# Patient Record
Sex: Female | Born: 1990 | Race: Black or African American | Hispanic: No | Marital: Single | Smoking: Never smoker
Health system: Southern US, Community
[De-identification: ages and names within clinical notes are randomized; demographics above are authoritative.]

## PROBLEM LIST (undated history)

## (undated) HISTORY — PX: OTHER SURGICAL HISTORY: SHX169

## (undated) HISTORY — PX: EYE SURGERY: SHX253

---
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
## 2012-01-04 DIAGNOSIS — Z0000 Encounter for general adult medical examination without abnormal findings: Secondary | ICD-10-CM | POA: Insufficient documentation

## 2016-06-28 ENCOUNTER — Encounter (HOSPITAL_BASED_OUTPATIENT_CLINIC_OR_DEPARTMENT_OTHER): Payer: Self-pay | Admitting: Emergency Medicine

## 2016-06-28 ENCOUNTER — Emergency Department (HOSPITAL_BASED_OUTPATIENT_CLINIC_OR_DEPARTMENT_OTHER)
Admission: EM | Admit: 2016-06-28 | Discharge: 2016-06-28 | Disposition: A | Discharge status: Home / Self Care | Payer: Self-pay | Attending: Emergency Medicine | Admitting: Emergency Medicine

## 2016-06-28 DIAGNOSIS — J020 Streptococcal pharyngitis: Secondary | ICD-10-CM | POA: Insufficient documentation

## 2016-06-28 DIAGNOSIS — R509 Fever, unspecified: Secondary | ICD-10-CM

## 2016-06-28 LAB — RAPID STREP SCREEN (MED CTR MEBANE ONLY): STREPTOCOCCUS, GROUP A SCREEN (DIRECT): POSITIVE — AB

## 2016-06-28 MED ORDER — ACETAMINOPHEN 500 MG PO TABS
1000.0000 mg | ORAL_TABLET | Freq: Once | ORAL | Status: AC
  Administered 2016-06-28: 1000 mg via ORAL
  Filled 2016-06-28: qty 2

## 2016-06-28 MED ORDER — IBUPROFEN 800 MG PO TABS
800.0000 mg | ORAL_TABLET | Freq: Once | ORAL | Status: AC
  Administered 2016-06-28: 800 mg via ORAL
  Filled 2016-06-28: qty 1

## 2016-06-28 MED ORDER — PENICILLIN G BENZATHINE 1200000 UNIT/2ML IM SUSP
1.2000 10*6.[IU] | Freq: Once | INTRAMUSCULAR | Status: AC
Start: 2016-06-28 — End: 2016-06-28
  Administered 2016-06-28: 1.2 10*6.[IU] via INTRAMUSCULAR
  Filled 2016-06-28: qty 2

## 2016-06-28 NOTE — ED Triage Notes (Signed)
Fevers, fatigue, sore throat since yesterday.

## 2016-06-28 NOTE — ED Provider Notes (Signed)
[facility]   CSN: [id number] Arrival date & time: 06/28/16  2203     History   Chief Complaint Chief Complaint  Patient presents with  . Fever    HPI Susan Williamson is a 25 y.o. female.  Pt has had a sore throat and fever since yesterday.  She has taken tylenol and ibuprofen, but still has a fever.  She feels like she is getting worse.      History reviewed. No pertinent past medical history.  There are no active problems to display for this patient.   Past Surgical History:  Procedure Laterality Date  . EYE SURGERY    . skull surgery      OB History    No data available       Home Medications    Prior to Admission medications   Not on File    Family History No family history on file.  Social History Social History  Substance Use Topics  . Smoking status: Never Smoker  . Smokeless tobacco: Never Used  . Alcohol use Yes     Comment: minimal     Allergies   Review of patient's allergies indicates no known allergies.   Review of Systems Review of Systems  Constitutional: Positive for fever.  HENT: Positive for sore throat.   All other systems reviewed and are negative.    Physical Exam Updated Vital Signs BP 141/83 (BP Location: Right Arm)   Pulse 114   Temp 100.3 F (37.9 C) (Oral)   Resp 18   Ht 5\' 8"  (1.727 m)   Wt 200 lb (90.7 kg)   LMP 06/28/2016 (Exact Date)   SpO2 99%   BMI 30.41 kg/m   Physical Exam  Constitutional: She is oriented to person, place, and time. She appears well-developed and well-nourished.  HENT:  Head: Normocephalic and atraumatic.  Right Ear: External ear normal.  Left Ear: External ear normal.  Mouth/Throat: Oropharyngeal exudate and posterior oropharyngeal erythema present.  Eyes: Conjunctivae and EOM are normal. Pupils are equal, round, and reactive to light.  Neck: Normal range of motion. Neck supple.  Cardiovascular: Normal rate, regular rhythm, normal heart sounds  and intact distal pulses.   Pulmonary/Chest: Effort normal and breath sounds normal.  Abdominal: Soft. Bowel sounds are normal.  Musculoskeletal: Normal range of motion.  Neurological: She is alert and oriented to person, place, and time.  Skin: Skin is warm and dry.  Psychiatric: She has a normal mood and affect. Her behavior is normal. Judgment and thought content normal.  Nursing note and vitals reviewed.    ED Treatments / Results  Labs (all labs ordered are listed, but only abnormal results are displayed) Labs Reviewed  RAPID STREP SCREEN (NOT AT [facility]) - Abnormal; Notable for the following:       Result Value   Streptococcus, Group A Screen (Direct) POSITIVE (*)    All other components within normal limits    EKG  EKG Interpretation None       Radiology No results found.  Procedures Procedures (including critical care time)  Medications Ordered in ED Medications  penicillin g benzathine (BICILLIN LA) 1200000 UNIT/2ML injection 1.2 Million Units (not administered)  ibuprofen (ADVIL,MOTRIN) tablet 800 mg (not administered)  acetaminophen (TYLENOL) tablet 1,000 mg (not administered)     Initial Impression / Assessment and Plan / ED Course  I have reviewed the triage vital signs and the nursing notes.  Pertinent labs & imaging results that were available  during my care of the patient were reviewed by me and considered in my medical decision making (see chart for details).  Clinical Course   Pt opted for bicillin la IM.  She did not want any pain meds.  She knows to take tylenol or ibuprofen for fever.  She knows to return if worse.  Final Clinical Impressions(s) / ED Diagnoses   Final diagnoses:  Strep pharyngitis  Fever, unspecified fever cause    New Prescriptions New Prescriptions   No medications on file     Jacalyn LefevreJulie Hattie Aguinaldo, MD 06/28/16 2315

## 2018-11-14 ENCOUNTER — Emergency Department: Payer: No Typology Code available for payment source

## 2018-11-14 ENCOUNTER — Emergency Department
Admission: EM | Admit: 2018-11-14 | Discharge: 2018-11-14 | Disposition: A | Discharge status: Home / Self Care | Payer: No Typology Code available for payment source | Attending: Emergency Medicine | Admitting: Emergency Medicine

## 2018-11-14 ENCOUNTER — Other Ambulatory Visit: Payer: Self-pay

## 2018-11-14 ENCOUNTER — Encounter: Payer: Self-pay | Admitting: Emergency Medicine

## 2018-11-14 DIAGNOSIS — Y92410 Unspecified street and highway as the place of occurrence of the external cause: Secondary | ICD-10-CM | POA: Diagnosis not present

## 2018-11-14 DIAGNOSIS — R51 Headache: Secondary | ICD-10-CM

## 2018-11-14 DIAGNOSIS — R42 Dizziness and giddiness: Secondary | ICD-10-CM | POA: Insufficient documentation

## 2018-11-14 DIAGNOSIS — S0990XA Unspecified injury of head, initial encounter: Secondary | ICD-10-CM | POA: Diagnosis not present

## 2018-11-14 DIAGNOSIS — Y93I9 Activity, other involving external motion: Secondary | ICD-10-CM | POA: Diagnosis not present

## 2018-11-14 DIAGNOSIS — Y998 Other external cause status: Secondary | ICD-10-CM | POA: Diagnosis not present

## 2018-11-14 DIAGNOSIS — R519 Headache, unspecified: Secondary | ICD-10-CM

## 2018-11-14 LAB — BASIC METABOLIC PANEL
Anion gap: 8 (ref 5–15)
BUN: 11 mg/dL (ref 6–20)
CALCIUM: 9.7 mg/dL (ref 8.9–10.3)
CO2: 26 mmol/L (ref 22–32)
Chloride: 104 mmol/L (ref 98–111)
Creatinine, Ser: 0.85 mg/dL (ref 0.44–1.00)
GFR calc non Af Amer: >60 mL/min (ref >60)
Glucose, Bld: 92 mg/dL (ref 70–99)
Potassium: 4 mmol/L (ref 3.5–5.1)
SODIUM: 138 mmol/L (ref 135–145)

## 2018-11-14 LAB — URINALYSIS, COMPLETE (UACMP) WITH MICROSCOPIC
BACTERIA UA: NONE SEEN
Bilirubin Urine: NEGATIVE
Glucose, UA: NEGATIVE mg/dL
Ketones, ur: NEGATIVE mg/dL
Leukocytes, UA: NEGATIVE
NITRITE: NEGATIVE
PROTEIN: NEGATIVE mg/dL
SPECIFIC GRAVITY, URINE: 1.016 (ref 1.005–1.030)
pH: 6 (ref 5.0–8.0)

## 2018-11-14 LAB — CBC WITH DIFFERENTIAL/PLATELET
Abs Immature Granulocytes: 0.05 10*3/uL (ref 0.00–0.07)
BASOS ABS: 0 10*3/uL (ref 0.0–0.1)
Basophils Relative: 0 %
EOS ABS: 0.2 10*3/uL (ref 0.0–0.5)
EOS PCT: 2 %
HCT: 41.1 % (ref 36.0–46.0)
Hemoglobin: 13.4 g/dL (ref 12.0–15.0)
Immature Granulocytes: 1 %
Lymphocytes Relative: 28 %
Lymphs Abs: 2.7 10*3/uL (ref 0.7–4.0)
MCH: 26.9 pg (ref 26.0–34.0)
MCHC: 32.6 g/dL (ref 30.0–36.0)
MCV: 82.5 fL (ref 80.0–100.0)
MONO ABS: 0.5 10*3/uL (ref 0.1–1.0)
Monocytes Relative: 5 %
NRBC: 0 % (ref 0.0–0.2)
Neutro Abs: 6.3 10*3/uL (ref 1.7–7.7)
Neutrophils Relative %: 64 %
Platelets: 327 10*3/uL (ref 150–400)
RBC: 4.98 MIL/uL (ref 3.87–5.11)
RDW: 13 % (ref 11.5–15.5)
WBC: 9.9 10*3/uL (ref 4.0–10.5)

## 2018-11-14 LAB — URINE DRUG SCREEN, QUALITATIVE (ARMC ONLY)
Amphetamines, Ur Screen: NOT DETECTED
Barbiturates, Ur Screen: NOT DETECTED
Benzodiazepine, Ur Scrn: NOT DETECTED
CANNABINOID 50 NG, UR ~~LOC~~: NOT DETECTED
COCAINE METABOLITE, UR ~~LOC~~: NOT DETECTED
MDMA (ECSTASY) UR SCREEN: NOT DETECTED
METHADONE SCREEN, URINE: NOT DETECTED
Opiate, Ur Screen: NOT DETECTED
Phencyclidine (PCP) Ur S: NOT DETECTED
TRICYCLIC, UR SCREEN: NOT DETECTED

## 2018-11-14 LAB — POCT PREGNANCY, URINE: PREG TEST UR: NEGATIVE
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
## 2018-11-14 NOTE — Discharge Instructions (Signed)
Advised only Tylenol for headache until evaluation by neurologist.
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
## 2018-11-14 NOTE — ED Notes (Signed)
Pt signed esignature.  D/c  inst to pt.  
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
## 2018-11-18 DIAGNOSIS — G479 Sleep disorder, unspecified: Secondary | ICD-10-CM | POA: Insufficient documentation

## 2018-11-18 DIAGNOSIS — R569 Unspecified convulsions: Secondary | ICD-10-CM | POA: Insufficient documentation

## 2018-11-18 DIAGNOSIS — R4020 Unspecified coma: Secondary | ICD-10-CM | POA: Insufficient documentation

## 2021-08-29 ENCOUNTER — Other Ambulatory Visit: Payer: Self-pay

## 2021-08-29 ENCOUNTER — Ambulatory Visit
Admission: EM | Admit: 2021-08-29 | Discharge: 2021-08-29 | Disposition: A | Discharge status: Home / Self Care | Payer: 59

## 2021-08-29 ENCOUNTER — Encounter: Payer: Self-pay | Admitting: Emergency Medicine

## 2021-08-29 DIAGNOSIS — G43909 Migraine, unspecified, not intractable, without status migrainosus: Secondary | ICD-10-CM

## 2021-08-29 MED ORDER — KETOROLAC TROMETHAMINE 30 MG/ML IJ SOLN
30.0000 mg | Freq: Once | INTRAMUSCULAR | Status: AC
  Administered 2021-08-29: 30 mg via INTRAMUSCULAR

## 2021-08-29 NOTE — ED Triage Notes (Signed)
Patient has been dealing with a migraine since Friday, still there Saturday, felt better on Sunday and this morning.  After awaking from a nap today, migraine is back.  Patient is feeling numbness in mouth and hands.  Patient has taken Advil, Excedrin.

## 2021-08-29 NOTE — ED Provider Notes (Signed)
[facility]    CSN: [id number] Arrival date & time: 08/29/21  1528      History   Chief Complaint Chief Complaint  Patient presents with   Migraine    HPI Susan Williamson is a 30 y.o. female.   Patient here today for evaluation of headache that has been ongoing for the last several days. She reports she did feel somewhat better this morning but then headache returned. She did have migraine once in the past that occurred after abruptly discontinuing contraceptive but endorses headaches since she was a child. She has had some mild nausea but no vomiting. She has also had sensitivity to light and sound today. She reports pain is located to the right frontal area of her head. She has tried advil and excedrin without resolution of headache today.   The history is provided by the patient.  Migraine Associated symptoms include headaches. Pertinent negatives include no shortness of breath.   History reviewed. No pertinent past medical history.  There are no problems to display for this patient.   Past Surgical History:  Procedure Laterality Date   EYE SURGERY     skull surgery      OB History   No obstetric history on file.      Home Medications    Prior to Admission medications   Medication Sig Start Date End Date Taking? Authorizing Provider  metFORMIN (GLUCOPHAGE) 500 MG tablet metformin 500 mg tablet  TAKE 1 TABLET BY MOUTH THREE TIMES A DAY   Yes [provider]  YAZ 3-0.02 MG tablet Take 1 tablet by mouth daily. 08/08/21  Yes [provider]    Family History History reviewed. No pertinent family history.  Social History Social History   Tobacco Use   Smoking status: Never   Smokeless tobacco: Never  Substance Use Topics   Alcohol use: Yes    Comment: minimal   Drug use: No     Allergies   Patient has no known allergies.   Review of Systems Review of Systems  Constitutional:  Negative for chills and fever.  HENT:   Negative for congestion.   Eyes:  Negative for discharge and redness.  Respiratory:  Negative for cough and shortness of breath.   Gastrointestinal:  Positive for nausea. Negative for vomiting.  Neurological:  Positive for headaches. Negative for facial asymmetry.    Physical Exam Triage Vital Signs ED Triage Vitals [08/29/21 1801]  Enc Vitals Group     BP 135/84     Pulse Rate 88     Resp      Temp 98.6 F (37 C)     Temp Source Oral     SpO2 96 %     Weight 209 lb (94.8 kg)     Height 5\' 7"  (1.702 m)     Head Circumference      Peak Flow      Pain Score 0     Pain Loc      Pain Edu?      Excl. in GC?    No data found.  Updated Vital Signs BP 135/84 (BP Location: Left Arm)   Pulse 88   Temp 98.6 F (37 C) (Oral)   Ht 5\' 7"  (1.702 m)   Wt 209 lb (94.8 kg)   LMP 08/08/2021   SpO2 96%   BMI 32.73 kg/m  Physical Exam Vitals and nursing note reviewed.  Constitutional:      General: She is not in  acute distress.    Appearance: Normal appearance. She is not ill-appearing, toxic-appearing or diaphoretic.  HENT:     Head: Normocephalic and atraumatic.  Eyes:     Extraocular Movements: Extraocular movements intact.     Conjunctiva/sclera: Conjunctivae normal.     Pupils: Pupils are equal, round, and reactive to light.  Cardiovascular:     Rate and Rhythm: Normal rate.  Pulmonary:     Effort: Pulmonary effort is normal.  Neurological:     Mental Status: She is alert and oriented to person, place, and time.     Coordination: Coordination is intact. Coordination normal. Finger-Nose-Finger Test normal.  Psychiatric:        Mood and Affect: Mood normal.        Behavior: Behavior normal.     UC Treatments / Results  Labs (all labs ordered are listed, but only abnormal results are displayed) Labs Reviewed - No data to display  EKG   Radiology No results found.  Procedures Procedures (including critical care time)  Medications Ordered in UC Medications   ketorolac (TORADOL) 30 MG/ML injection 30 mg (30 mg Intramuscular Given 08/29/21 1818)    Initial Impression / Assessment and Plan / UC Course  I have reviewed the triage vital signs and the nursing notes.  Pertinent labs & imaging results that were available during my care of the patient were reviewed by me and considered in my medical decision making (see chart for details).   Toradol injection administered to treat suspect migraine. Recommended follow up in the ED if symptoms worsen in any way. Suspect reported numbness is symptom of her migraine but strongly urged emergent evaluation in ED if this does not improve or worsens.   Final Clinical Impressions(s) / UC Diagnoses   Final diagnoses:  Migraine without status migrainosus, not intractable, unspecified migraine type   Discharge Instructions   None    ED Prescriptions   None    PDMP not reviewed this encounter.   Tomi Bamberger, PA-C 08/29/21 1829

## 2021-08-30 ENCOUNTER — Other Ambulatory Visit: Payer: Self-pay

## 2021-08-30 ENCOUNTER — Emergency Department (HOSPITAL_COMMUNITY)
Admission: EM | Admit: 2021-08-30 | Discharge: 2021-08-31 | Disposition: A | Discharge status: Home / Self Care | Payer: 59 | Attending: Emergency Medicine | Admitting: Emergency Medicine

## 2021-08-30 ENCOUNTER — Encounter (HOSPITAL_COMMUNITY): Payer: Self-pay

## 2021-08-30 DIAGNOSIS — N9489 Other specified conditions associated with female genital organs and menstrual cycle: Secondary | ICD-10-CM | POA: Diagnosis not present

## 2021-08-30 DIAGNOSIS — R519 Headache, unspecified: Secondary | ICD-10-CM | POA: Diagnosis not present

## 2021-08-30 LAB — I-STAT BETA HCG BLOOD, ED (MC, WL, AP ONLY): I-stat hCG, quantitative: <5 m[IU]/mL (ref <5)

## 2021-08-30 LAB — CBC WITH DIFFERENTIAL/PLATELET
Abs Immature Granulocytes: 0.05 10*3/uL (ref 0.00–0.07)
Basophils Absolute: 0 10*3/uL (ref 0.0–0.1)
Basophils Relative: 0 %
Eosinophils Absolute: 0.2 10*3/uL (ref 0.0–0.5)
Eosinophils Relative: 3 %
HCT: 36.1 % (ref 36.0–46.0)
Hemoglobin: 11.9 g/dL — ABNORMAL LOW (ref 12.0–15.0)
Immature Granulocytes: 1 %
Lymphocytes Relative: 34 %
Lymphs Abs: 3.1 10*3/uL (ref 0.7–4.0)
MCH: 27.4 pg (ref 26.0–34.0)
MCHC: 33 g/dL (ref 30.0–36.0)
MCV: 83 fL (ref 80.0–100.0)
Monocytes Absolute: 0.6 10*3/uL (ref 0.1–1.0)
Monocytes Relative: 6 %
Neutro Abs: 5.1 10*3/uL (ref 1.7–7.7)
Neutrophils Relative %: 56 %
Platelets: 362 10*3/uL (ref 150–400)
RBC: 4.35 MIL/uL (ref 3.87–5.11)
RDW: 12.9 % (ref 11.5–15.5)
WBC: 9 10*3/uL (ref 4.0–10.5)
nRBC: 0 % (ref 0.0–0.2)

## 2021-08-30 LAB — COMPREHENSIVE METABOLIC PANEL
ALT: 18 U/L (ref 0–44)
AST: 24 U/L (ref 15–41)
Albumin: 3.4 g/dL — ABNORMAL LOW (ref 3.5–5.0)
Alkaline Phosphatase: 39 U/L (ref 38–126)
Anion gap: 8 (ref 5–15)
BUN: 12 mg/dL (ref 6–20)
CO2: 21 mmol/L — ABNORMAL LOW (ref 22–32)
Calcium: 8.6 mg/dL — ABNORMAL LOW (ref 8.9–10.3)
Chloride: 108 mmol/L (ref 98–111)
Creatinine, Ser: 0.7 mg/dL (ref 0.44–1.00)
GFR, Estimated: >60 mL/min (ref >60)
Glucose, Bld: 145 mg/dL — ABNORMAL HIGH (ref 70–99)
Potassium: 3.9 mmol/L (ref 3.5–5.1)
Sodium: 137 mmol/L (ref 135–145)
Total Bilirubin: 0.2 mg/dL — ABNORMAL LOW (ref 0.3–1.2)
Total Protein: 6.8 g/dL (ref 6.5–8.1)

## 2021-08-30 LAB — URINALYSIS, ROUTINE W REFLEX MICROSCOPIC
Bacteria, UA: NONE SEEN
Bilirubin Urine: NEGATIVE
Glucose, UA: NEGATIVE mg/dL
Hgb urine dipstick: NEGATIVE
Ketones, ur: NEGATIVE mg/dL
Nitrite: NEGATIVE
Protein, ur: NEGATIVE mg/dL
Specific Gravity, Urine: 1.026 (ref 1.005–1.030)
pH: 5 (ref 5.0–8.0)

## 2021-08-30 NOTE — ED Notes (Signed)
Labeled specimen cup provided to pt for urine collection per MD order. ENMiles 

## 2021-08-30 NOTE — ED Triage Notes (Signed)
Pt complains of migraine since Friday. Pt states that she was given a pain shot yesterday for her migraine.

## 2021-08-31 MED ORDER — PROCHLORPERAZINE EDISYLATE 10 MG/2ML IJ SOLN
10.0000 mg | Freq: Once | INTRAMUSCULAR | Status: AC
  Administered 2021-08-31: 10 mg via INTRAMUSCULAR
  Filled 2021-08-31: qty 2

## 2021-08-31 MED ORDER — DEXAMETHASONE 4 MG PO TABS
10.0000 mg | ORAL_TABLET | Freq: Once | ORAL | Status: AC
  Administered 2021-08-31: 10 mg via ORAL
  Filled 2021-08-31: qty 2

## 2021-08-31 MED ORDER — DIPHENHYDRAMINE HCL 50 MG/ML IJ SOLN
25.0000 mg | Freq: Once | INTRAMUSCULAR | Status: AC
  Administered 2021-08-31: 25 mg via INTRAMUSCULAR
  Filled 2021-08-31: qty 1

## 2021-08-31 NOTE — ED Provider Notes (Signed)
[facility]-EMERGENCY DEPT Provider Note   CSN: [id number] Arrival date & time: 08/30/21  1947     History Chief Complaint  Patient presents with   Migraine    Susan Williamson is a 30 y.o. female.  30 yo F with a chief complaints of a headache.  This been going on for about 4 days now.  Has a history of headaches in the past but describes them usually as mild.  This feels similar but is a bit worse and lasting a bit longer than normal.  Her mother has a history of migraines.  The patient has been seeing a neurologist in the office.   As they have been in the ED they feel that their symptoms have gotten a bit worse.  Describing diffuse uncomfortableness.  Has trouble describing it.  Feels numb to the hands symptoms around the mouth.  Denies head injury denies cough congestion or fever.  The history is provided by the patient and a significant other.  Migraine This is a new problem. The current episode started less than 1 hour ago. The problem occurs constantly. The problem has not changed since onset.Pertinent negatives include no chest pain, no headaches and no shortness of breath. Nothing aggravates the symptoms. Nothing relieves the symptoms. She has tried nothing for the symptoms. The treatment provided no relief.      History reviewed. No pertinent past medical history.  There are no problems to display for this patient.   Past Surgical History:  Procedure Laterality Date   EYE SURGERY     skull surgery       OB History   No obstetric history on file.     History reviewed. No pertinent family history.  Social History   Tobacco Use   Smoking status: Never   Smokeless tobacco: Never  Substance Use Topics   Alcohol use: Yes    Comment: minimal   Drug use: No    Home Medications Prior to Admission medications   Medication Sig Start Date End Date Taking? Authorizing Provider  metFORMIN (GLUCOPHAGE) 500 MG tablet Take 500 mg by mouth 2 (two)  times daily with a meal.   Yes [provider]  YAZ 3-0.02 MG tablet Take 1 tablet by mouth daily. 08/08/21  Yes [provider]    Allergies    Patient has no known allergies.  Review of Systems   Review of Systems  Constitutional:  Negative for chills and fever.  HENT:  Negative for congestion and rhinorrhea.   Eyes:  Negative for redness and visual disturbance.  Respiratory:  Negative for shortness of breath and wheezing.   Cardiovascular:  Negative for chest pain and palpitations.  Gastrointestinal:  Negative for nausea and vomiting.  Genitourinary:  Negative for dysuria and urgency.  Musculoskeletal:  Negative for arthralgias and myalgias.  Skin:  Negative for pallor and wound.  Neurological:  Negative for dizziness and headaches.   Physical Exam Updated Vital Signs BP (!) 145/99 (BP Location: Left Arm)   Pulse 78   Temp 98.3 F (36.8 C) (Oral)   Resp 18   Ht 5\' 7"  (1.702 m)   Wt 94.8 kg   LMP 08/08/2021   SpO2 100%   BMI 32.73 kg/m   Physical Exam Vitals and nursing note reviewed.  Constitutional:      General: She is not in acute distress.    Appearance: She is well-developed. She is not diaphoretic.  HENT:     Head: Normocephalic and  atraumatic.  Eyes:     Pupils: Pupils are equal, round, and reactive to light.  Cardiovascular:     Rate and Rhythm: Normal rate and regular rhythm.     Heart sounds: No murmur heard.   No friction rub. No gallop.  Pulmonary:     Effort: Pulmonary effort is normal.     Breath sounds: No wheezing or rales.  Abdominal:     General: There is no distension.     Palpations: Abdomen is soft.     Tenderness: There is no abdominal tenderness.  Musculoskeletal:        General: No tenderness.     Cervical back: Normal range of motion and neck supple.  Skin:    General: Skin is warm and dry.  Neurological:     Mental Status: She is alert and oriented to person, place, and time.     GCS: GCS eye subscore is 4. GCS  verbal subscore is 5. GCS motor subscore is 6.     Cranial Nerves: Cranial nerves 2-12 are intact.     Sensory: Sensation is intact.     Motor: Motor function is intact.     Coordination: Coordination is intact.     Comments: Slightly unsteady gait though able to ambulate independently without issue.  Psychiatric:        Behavior: Behavior normal.    ED Results / Procedures / Treatments   Labs (all labs ordered are listed, but only abnormal results are displayed) Labs Reviewed  URINALYSIS, ROUTINE W REFLEX MICROSCOPIC - Abnormal; Notable for the following components:      Result Value   APPearance HAZY (*)    Leukocytes,Ua TRACE (*)    All other components within normal limits  CBC WITH DIFFERENTIAL/PLATELET - Abnormal; Notable for the following components:   Hemoglobin 11.9 (*)    All other components within normal limits  COMPREHENSIVE METABOLIC PANEL - Abnormal; Notable for the following components:   CO2 21 (*)    Glucose, Bld 145 (*)    Calcium 8.6 (*)    Albumin 3.4 (*)    Total Bilirubin 0.2 (*)    All other components within normal limits  I-STAT BETA HCG BLOOD, ED ([facility], [facility], [facility] ONLY)    EKG None  Radiology No results found.  Procedures Procedures   Medications Ordered in ED Medications  prochlorperazine (COMPAZINE) injection 10 mg (10 mg Intramuscular Given 08/31/21 0028)  diphenhydrAMINE (BENADRYL) injection 25 mg (25 mg Intramuscular Given 08/31/21 0029)  dexamethasone (DECADRON) tablet 10 mg (10 mg Oral Given 08/31/21 3818)    ED Course  I have reviewed the triage vital signs and the nursing notes.  Pertinent labs & imaging results that were available during my care of the patient were reviewed by me and considered in my medical decision making (see chart for details).    MDM Rules/Calculators/A&P                           30 yo genetic female comes in with a chief complaints of headache.  Going on for about 4 days now.  Benign neurologic exam for  me.  We will treat with a headache cocktail.  Neurology follow-up.  Patient is feeling mildly better.  Will discharge home.  1:50 AM:  I have discussed the diagnosis/risks/treatment options with the patient and signficant other  and believe the pt to be eligible for discharge home to follow-up with Neuro, PCP. We  also discussed returning to the ED immediately if new or worsening sx occur. We discussed the sx which are most concerning (e.g., sudden worsening pain, fever, inability to tolerate by mouth) that necessitate immediate return. Medications administered to the patient during their visit and any new prescriptions provided to the patient are listed below.  Medications given during this visit Medications  prochlorperazine (COMPAZINE) injection 10 mg (10 mg Intramuscular Given 08/31/21 0028)  diphenhydrAMINE (BENADRYL) injection 25 mg (25 mg Intramuscular Given 08/31/21 0029)  dexamethasone (DECADRON) tablet 10 mg (10 mg Oral Given 08/31/21 0027)     The patient appears reasonably screen and/or stabilized for discharge and I doubt any other medical condition or other [facility] requiring further screening, evaluation, or treatment in the ED at this time prior to discharge.   Final Clinical Impression(s) / ED Diagnoses Final diagnoses:  Bad headache    Rx / DC Orders ED Discharge Orders          Ordered    Ambulatory referral to Neurology       Comments: Headache syndrome   08/31/21 0135             Melene Plan, DO 08/31/21 0150

## 2021-08-31 NOTE — ED Notes (Signed)
Dr. Floyd at bedside. 

## 2021-08-31 NOTE — ED Notes (Addendum)
Pt ambulatory with a steady gait from triage to [place] [facility].

## 2021-08-31 NOTE — Discharge Instructions (Signed)
Return for worsening headache one-sided numbness or weakness difficulty with speech or swallowing.  Please follow-up with your family doctor and neurology.

## 2021-09-14 ENCOUNTER — Other Ambulatory Visit: Payer: Self-pay | Admitting: Physician Assistant

## 2021-09-14 ENCOUNTER — Other Ambulatory Visit (HOSPITAL_BASED_OUTPATIENT_CLINIC_OR_DEPARTMENT_OTHER): Payer: Self-pay | Admitting: Physician Assistant

## 2021-09-14 DIAGNOSIS — G479 Sleep disorder, unspecified: Secondary | ICD-10-CM

## 2021-09-14 DIAGNOSIS — R200 Anesthesia of skin: Secondary | ICD-10-CM

## 2021-09-14 DIAGNOSIS — R202 Paresthesia of skin: Secondary | ICD-10-CM

## 2021-09-14 DIAGNOSIS — R519 Headache, unspecified: Secondary | ICD-10-CM

## 2021-09-14 DIAGNOSIS — G8929 Other chronic pain: Secondary | ICD-10-CM

## 2021-09-14 DIAGNOSIS — F411 Generalized anxiety disorder: Secondary | ICD-10-CM

## 2021-09-14 DIAGNOSIS — R5383 Other fatigue: Secondary | ICD-10-CM

## 2021-09-14 DIAGNOSIS — R55 Syncope and collapse: Secondary | ICD-10-CM

## 2021-09-21 ENCOUNTER — Other Ambulatory Visit: Payer: Self-pay

## 2021-09-21 ENCOUNTER — Ambulatory Visit
Admission: RE | Admit: 2021-09-21 | Discharge: 2021-09-21 | Disposition: A | Discharge status: Home / Self Care | Payer: 59 | Source: Ambulatory Visit | Attending: Physician Assistant | Admitting: Physician Assistant

## 2021-09-21 DIAGNOSIS — R202 Paresthesia of skin: Secondary | ICD-10-CM | POA: Diagnosis present

## 2021-09-21 DIAGNOSIS — R5383 Other fatigue: Secondary | ICD-10-CM | POA: Diagnosis present

## 2021-09-21 DIAGNOSIS — F411 Generalized anxiety disorder: Secondary | ICD-10-CM | POA: Diagnosis present

## 2021-09-21 DIAGNOSIS — G8929 Other chronic pain: Secondary | ICD-10-CM | POA: Insufficient documentation

## 2021-09-21 DIAGNOSIS — R200 Anesthesia of skin: Secondary | ICD-10-CM | POA: Diagnosis present

## 2021-09-21 DIAGNOSIS — R55 Syncope and collapse: Secondary | ICD-10-CM | POA: Insufficient documentation

## 2021-09-21 DIAGNOSIS — G479 Sleep disorder, unspecified: Secondary | ICD-10-CM | POA: Insufficient documentation

## 2021-09-21 DIAGNOSIS — R519 Headache, unspecified: Secondary | ICD-10-CM | POA: Diagnosis not present

## 2021-09-21 IMAGING — MR MR HEAD W/O CM
6 of 7 series · 24 of 48 positions shown · non-contrast
Comparison: CT head [DATE].

CLINICAL DATA: Chronic nonintractable headache, unspecified
headache type R51.9, [D7] ([D7]-CM)

Other fatigue [D7] ([D7]-CM)
Syncope, unspecified syncope type R55 ([D7]-CM)
Anxiety, generalized [D7] ([D7]-CM)
Sleep disorder [D7] ([D7]-CM)Difficulty sleeping [D7]
([D7]-CM)
Numbness and tingling R20.0, [D7] ([D7]-CM)
EXAM:
MRI HEAD WITHOUT CONTRAST
TECHNIQUE: Multiplanar, multiecho pulse sequences of the brain and surrounding
structures were obtained without intravenous contrast.

[Series 2: T1 · sagittal · 5.0mm · 0.45mm/px · 2 of 23 slices shown]
[im 1/23]
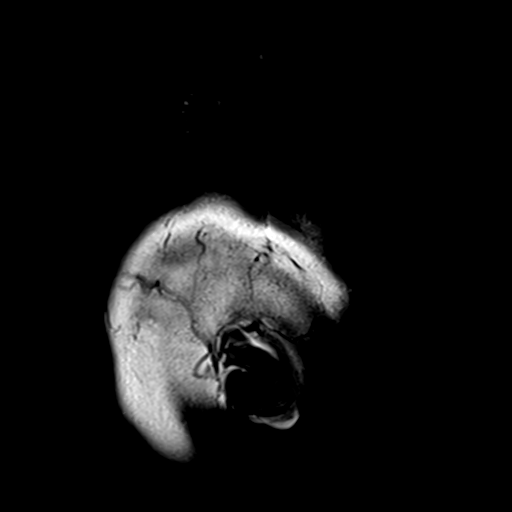
[im 8/23]
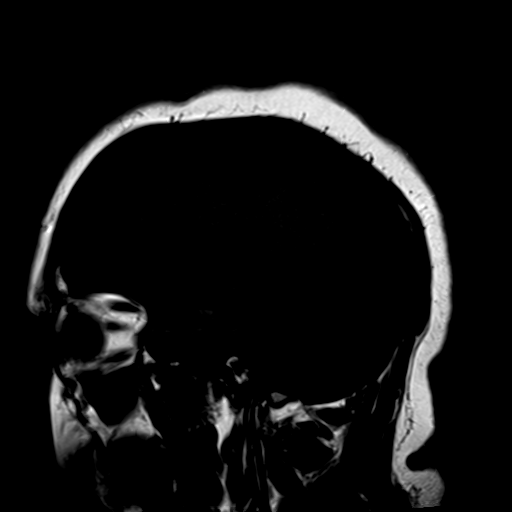

[Series 3: T2 · axial · 5.0mm · 0.90mm/px · z∈[-91,+63]mm · 3 of 23 slices shown (1 of 3)]
[im 1/23]
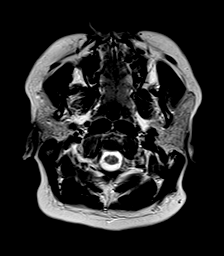
[im 12/23]
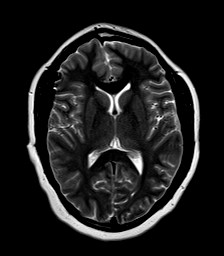
[im 23/23]
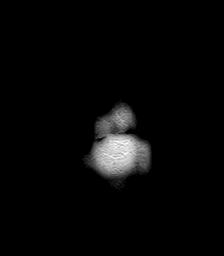

[Series 4: FLAIR · axial · 3.0mm · 0.45mm/px · z∈[-95,+67]mm · 8 of 55 slices shown (1 of 2)]
[im 1/55]
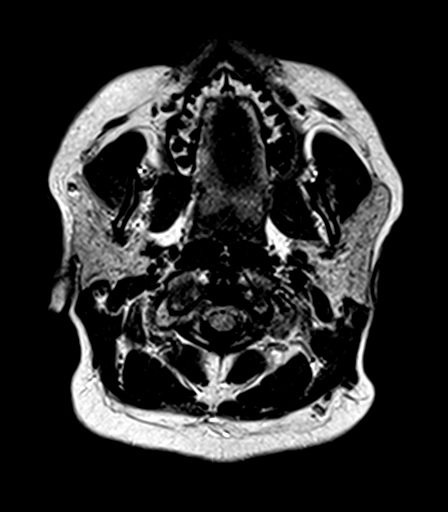
[im 8/55]
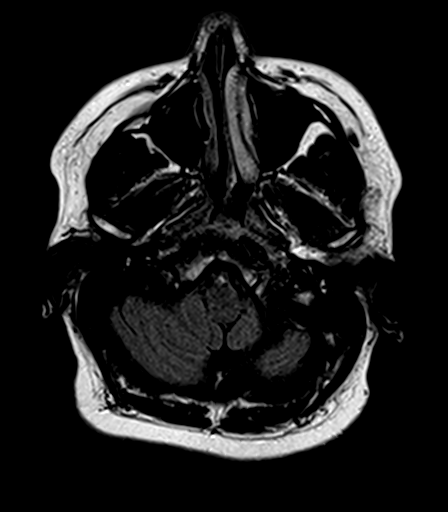
[im 16/55]
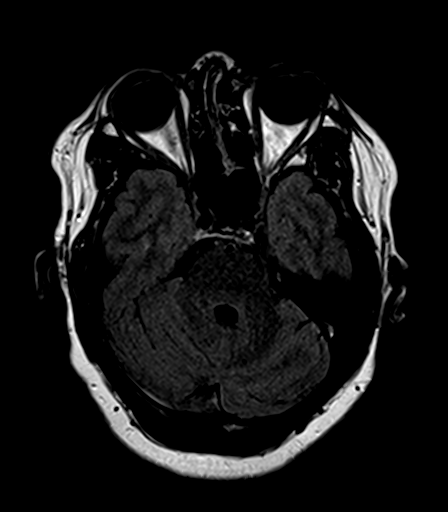
[im 24/55]
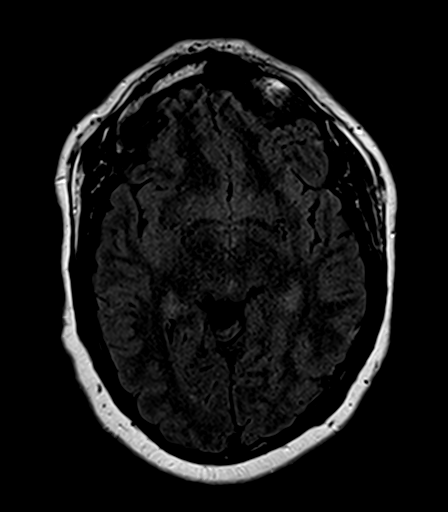
[im 31/55]
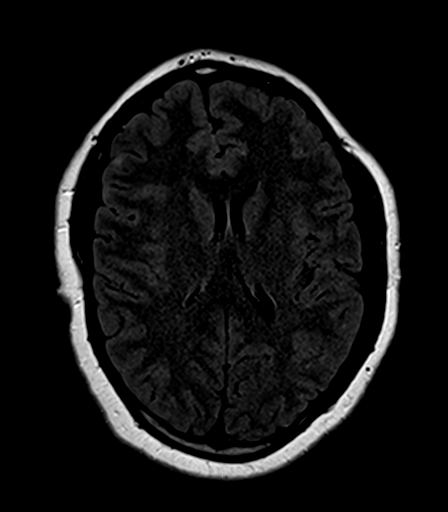
[im 39/55]
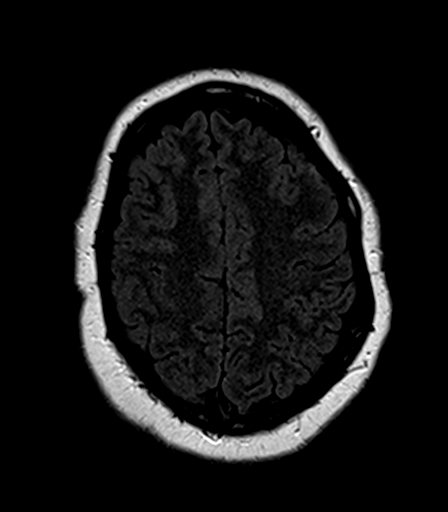
[im 47/55]
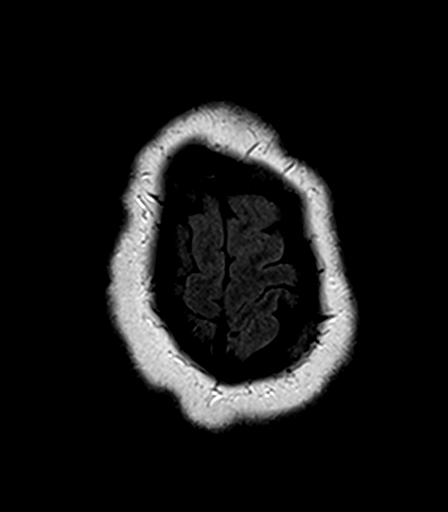
[im 55/55]
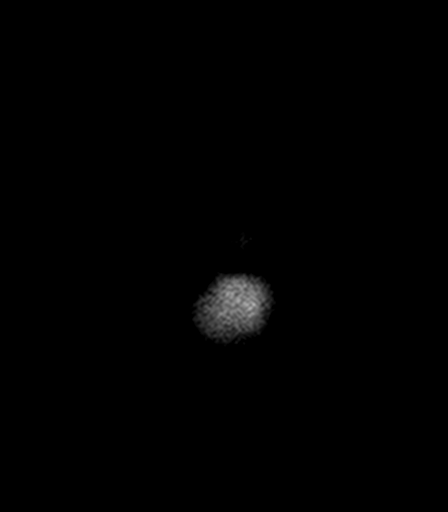

[Series 5: T2 · axial · 5.0mm · 0.72mm/px · z∈[-91,+63]mm · 3 of 23 slices shown (2 of 3)]
[im 1/23]
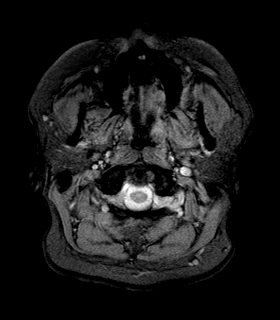
[im 12/23]
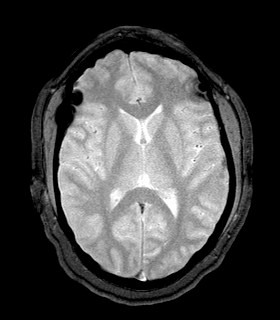
[im 23/23]
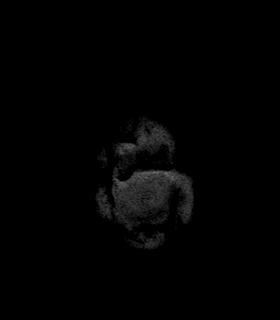

[Series 7: T2 · coronal · 5.0mm · 0.43mm/px · 4 of 29 slices shown (3 of 3)]
[im 1/29]
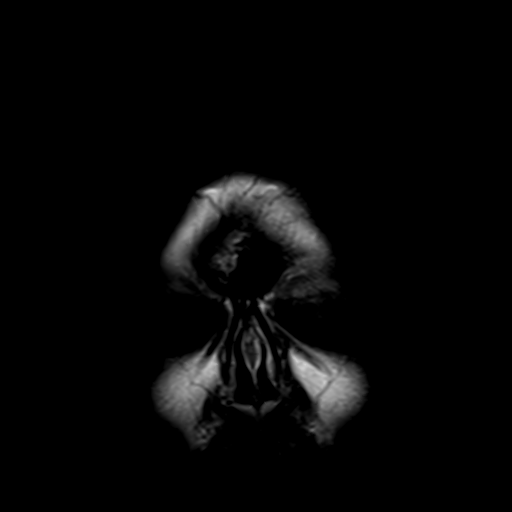
[im 10/29]
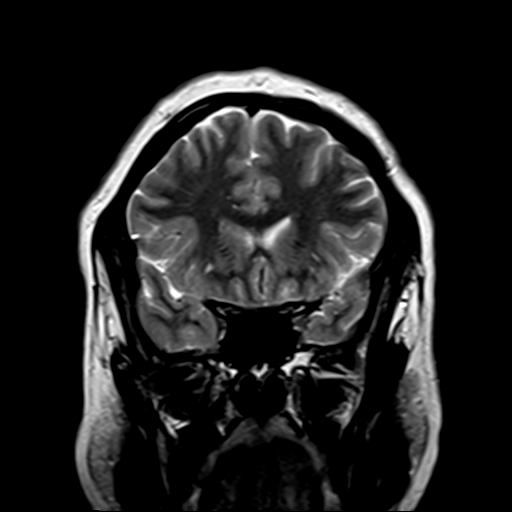
[im 19/29]
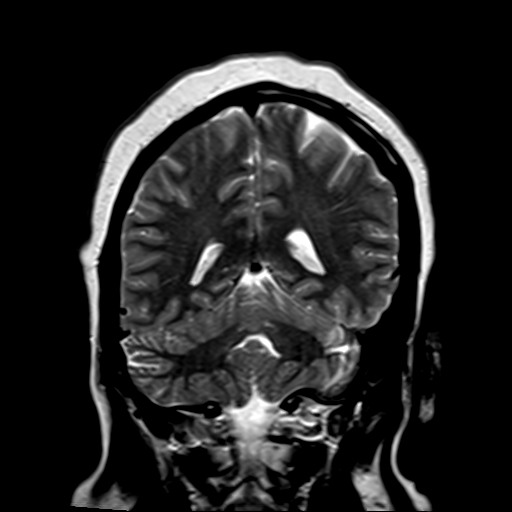
[im 29/29]
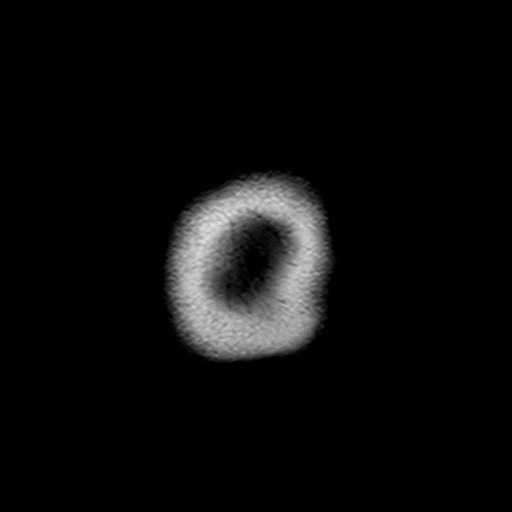

[Series 8: FLAIR · sagittal · 4.0mm · 0.45mm/px · 4 of 30 slices shown (2 of 2)]
[im 1/30]
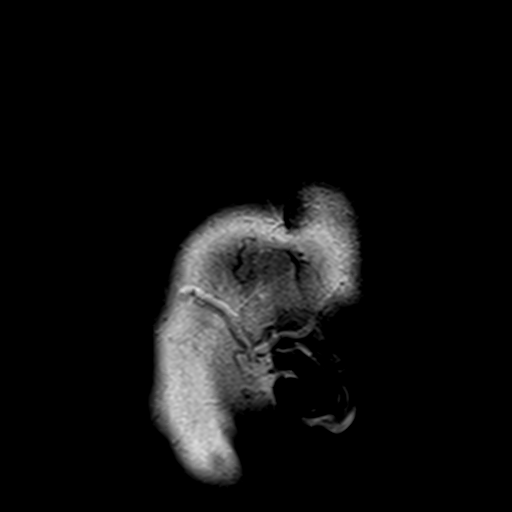
[im 10/30]
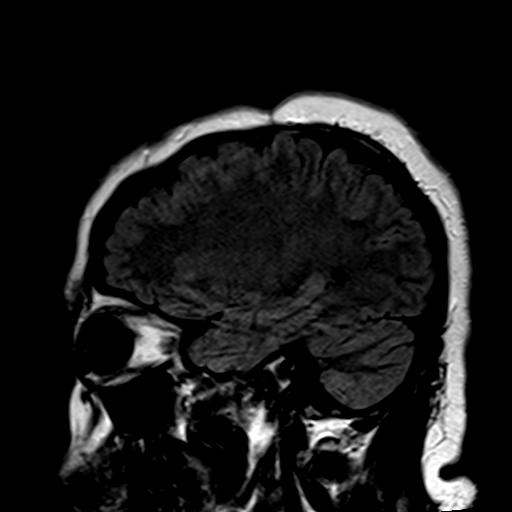
[im 20/30]
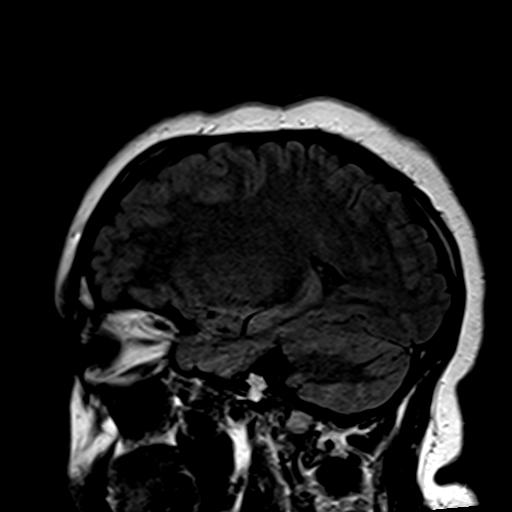
[im 30/30]
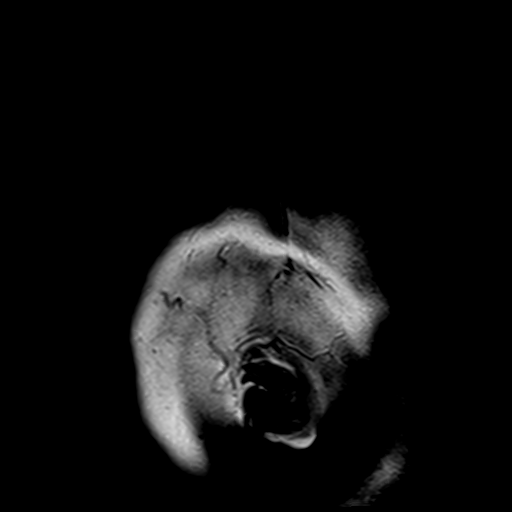

[24 of 48 positions shown; findings below may reference images not displayed]

FINDINGS: Brain: No acute infarction, acute hemorrhage, hydrocephalus,
extra-axial collection or mass lesion. Areas of susceptibility
artifact along the frontal convexity, likely related to postsurgical
change and possibly old hemorrhage given prior skull surgery and
postsurgical changes seen on prior CT head.

Vascular: Major arterial flow voids are maintained.

Skull and upper cervical spine: Postsurgical changes of the right
frontal calvarium. Otherwise, normal marrow signal.

Sinuses/Orbits: Largely clear sinuses.  Unremarkable orbits.

Other: No sizable mastoid effusions.
IMPRESSION: 1. No evidence of acute abnormality.
2. Areas of susceptibility artifact along the frontal convexity,
likely related to postsurgical change and possibly old hemorrhage
given prior skull surgery and postsurgical changes seen on prior CT
head.

## 2021-09-21 IMAGING — MR MR HEAD W/O CM
4 series · 36 of 48 positions shown · non-contrast
Comparison: CT head [DATE].

CLINICAL DATA: Chronic nonintractable headache, unspecified
headache type R51.9, [D7] ([D7]-CM)

Other fatigue [D7] ([D7]-CM)
Syncope, unspecified syncope type R55 ([D7]-CM)
Anxiety, generalized [D7] ([D7]-CM)
Sleep disorder [D7] ([D7]-CM)Difficulty sleeping [D7]
([D7]-CM)
Numbness and tingling R20.0, [D7] ([D7]-CM)
EXAM:
MRI HEAD WITHOUT CONTRAST
TECHNIQUE: Multiplanar, multiecho pulse sequences of the brain and surrounding
structures were obtained without intravenous contrast.

[Series 5: ax dwi_tracew · axial · 3.0mm · 0.65mm/px · z∈[-105,+50]mm · 9 of 48 slices shown]
[im 1/48]
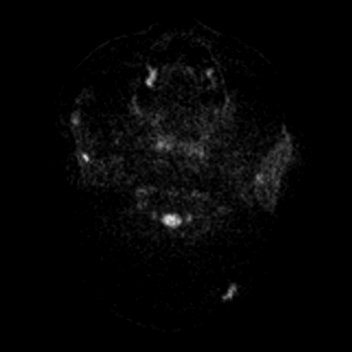
[im 8/48]
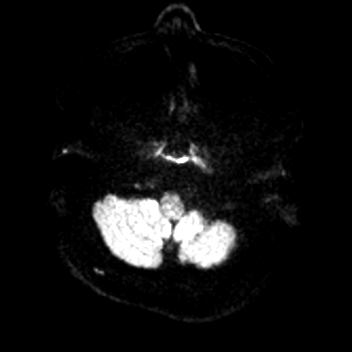
[im 16/48]
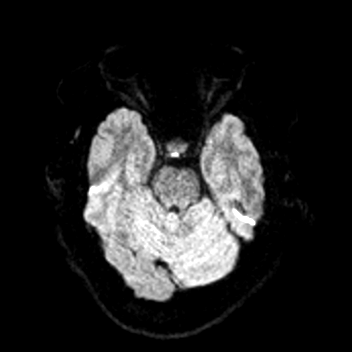
[im 20/48]
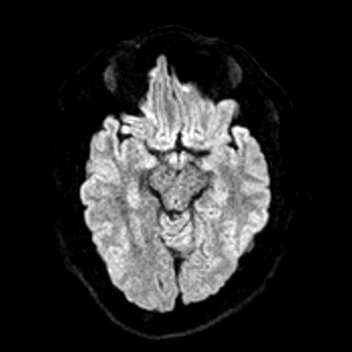
[im 24/48]
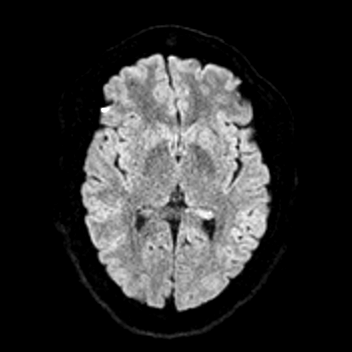
[im 28/48]
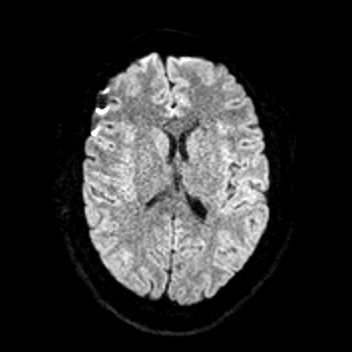
[im 32/48]
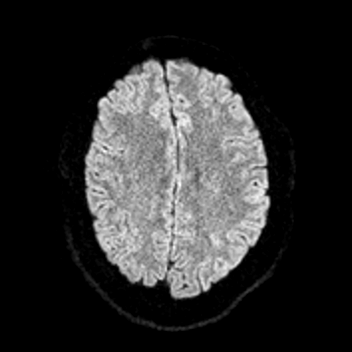
[im 40/48]
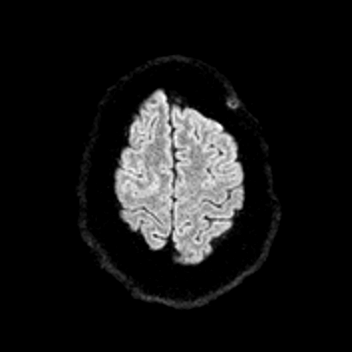
[im 48/48]
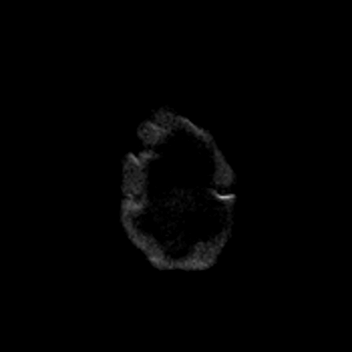

[Series 6: ax dwi_adc · axial · 3.0mm · 0.65mm/px · z∈[-105,+50]mm · 9 of 48 slices shown]
[im 1/48]
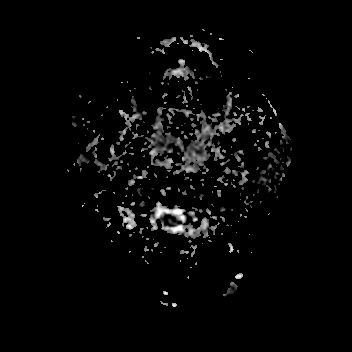
[im 8/48]
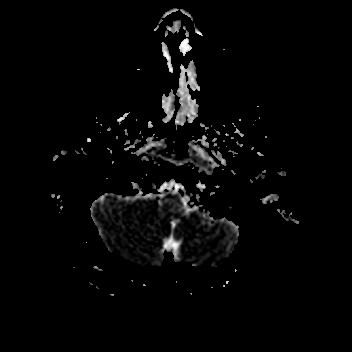
[im 16/48]
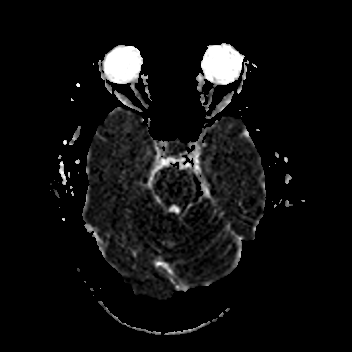
[im 20/48]
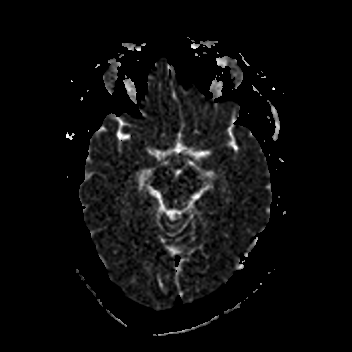
[im 24/48]
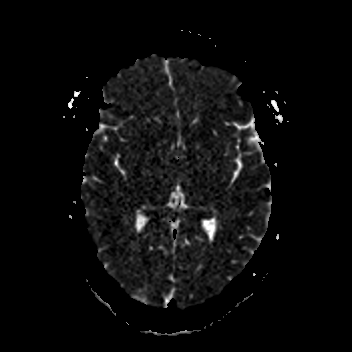
[im 28/48]
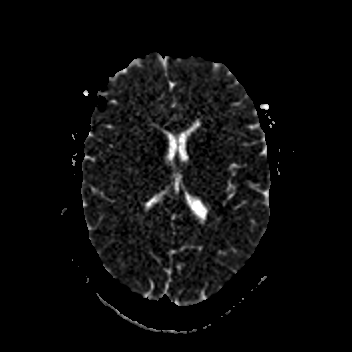
[im 32/48]
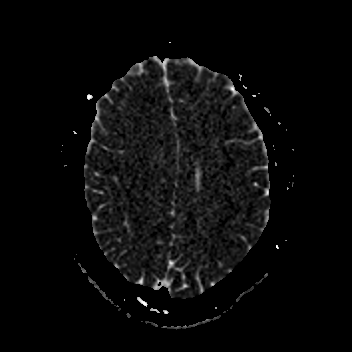
[im 40/48]
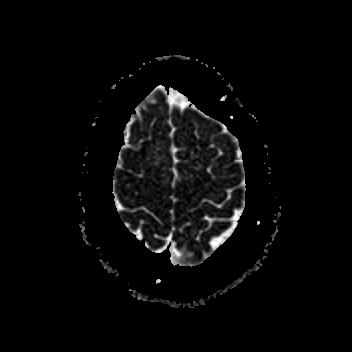
[im 48/48]
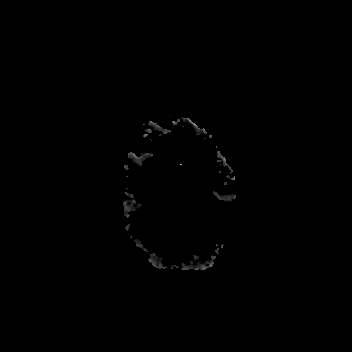

[Series 7: cor dwi_tracew · coronal · 5.0mm · 0.60mm/px · 11 of 38 slices shown]
[im 1/38]
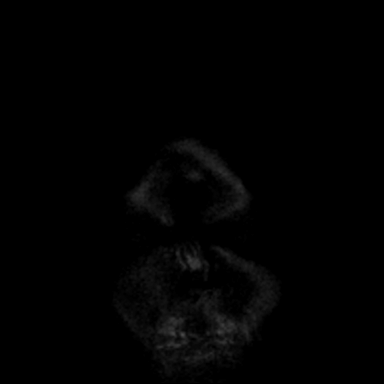
[im 4/38]
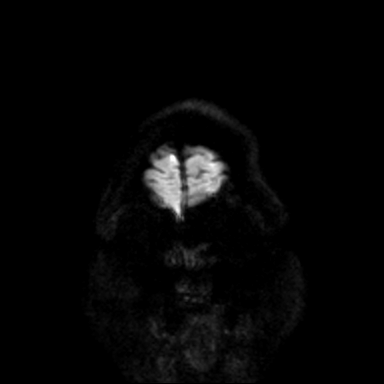
[im 8/38]
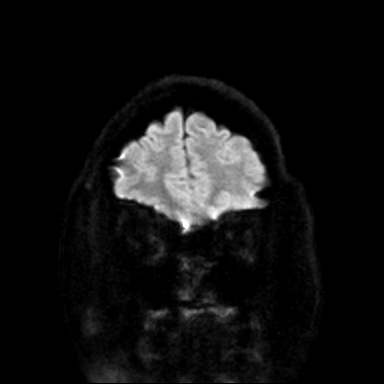
[im 12/38]
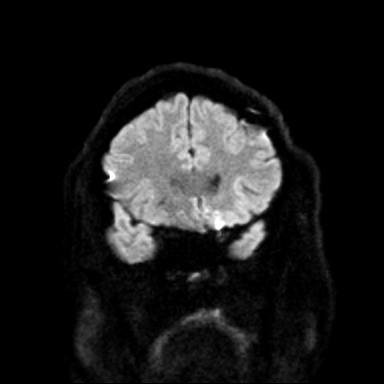
[im 15/38]
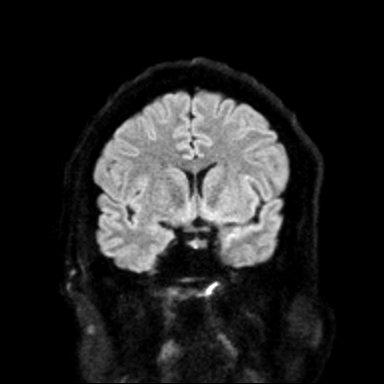
[im 19/38]
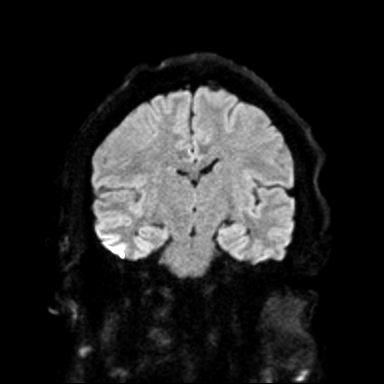
[im 23/38]
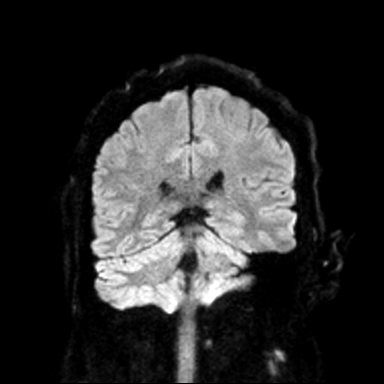
[im 26/38]
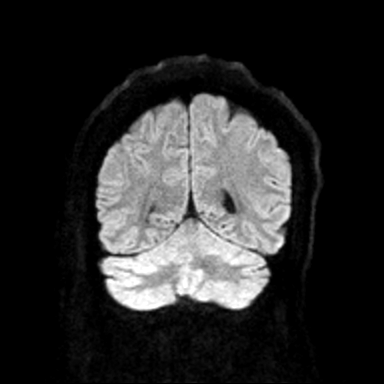
[im 30/38]
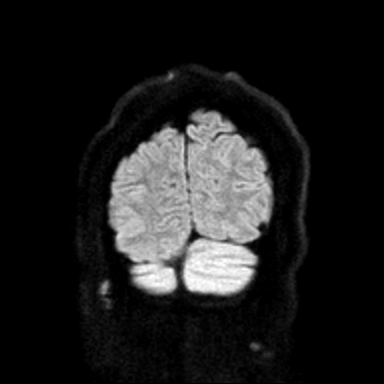
[im 34/38]
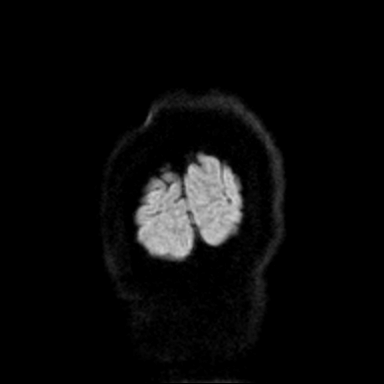
[im 38/38]
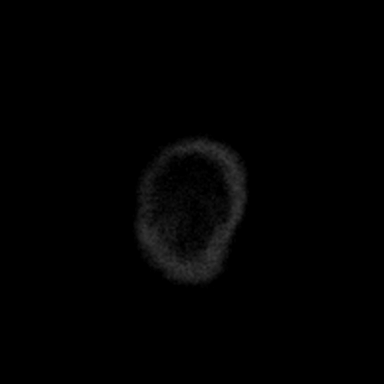

[Series 8: cor dwi_adc · coronal · 5.0mm · 0.60mm/px · 7 of 38 slices shown]
[im 1/38]
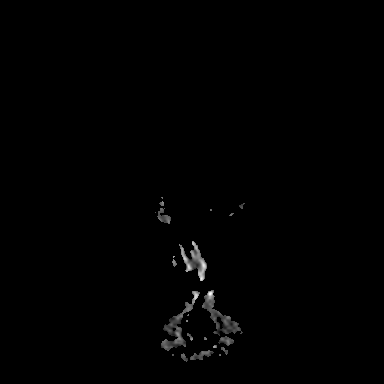
[im 4/38]
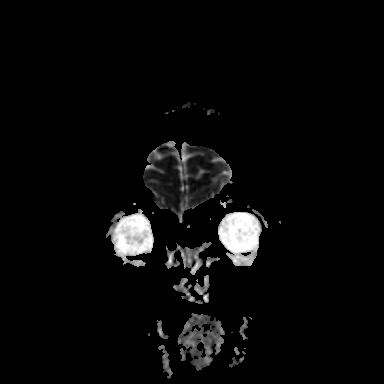
[im 8/38]
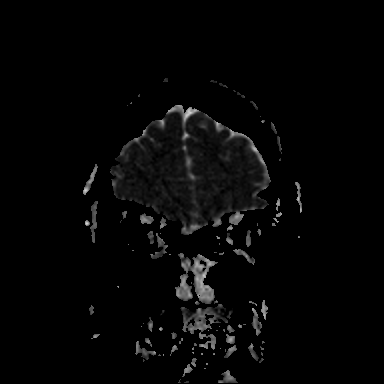
[im 12/38]
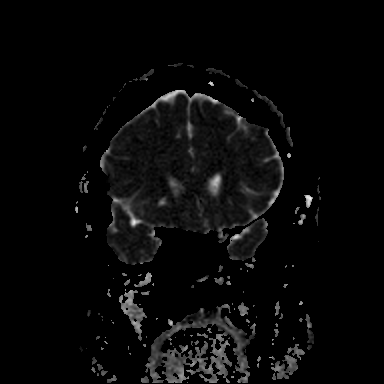
[im 15/38]
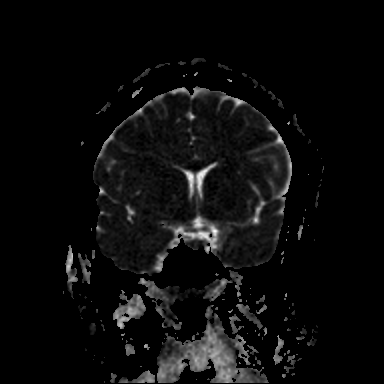
[im 19/38]
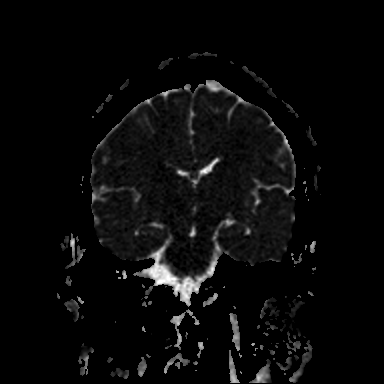
[im 34/38]
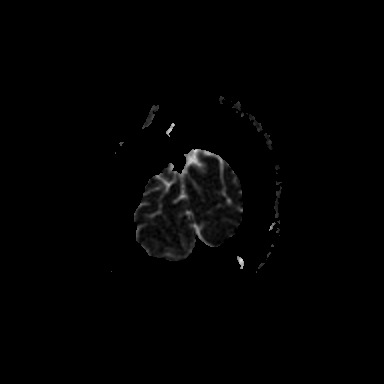

[36 of 48 positions shown; findings below may reference images not displayed]

FINDINGS: Brain: No acute infarction, acute hemorrhage, hydrocephalus,
extra-axial collection or mass lesion. Areas of susceptibility
artifact along the frontal convexity, likely related to postsurgical
change and possibly old hemorrhage given prior skull surgery and
postsurgical changes seen on prior CT head.

Vascular: Major arterial flow voids are maintained.

Skull and upper cervical spine: Postsurgical changes of the right
frontal calvarium. Otherwise, normal marrow signal.

Sinuses/Orbits: Largely clear sinuses.  Unremarkable orbits.

Other: No sizable mastoid effusions.
IMPRESSION: 1. No evidence of acute abnormality.
2. Areas of susceptibility artifact along the frontal convexity,
likely related to postsurgical change and possibly old hemorrhage
given prior skull surgery and postsurgical changes seen on prior CT
head.

## 2021-09-28 ENCOUNTER — Encounter: Payer: Self-pay | Admitting: Physician Assistant

## 2021-09-28 ENCOUNTER — Encounter (INDEPENDENT_AMBULATORY_CARE_PROVIDER_SITE_OTHER): Payer: Self-pay

## 2021-09-28 ENCOUNTER — Ambulatory Visit: Payer: 59 | Admitting: Physician Assistant

## 2021-09-28 ENCOUNTER — Other Ambulatory Visit: Payer: Self-pay

## 2021-09-28 VITALS — BP 116/78 | HR 85 | Temp 98.3°F | Resp 18 | Ht 66.54 in | Wt 224.4 lb

## 2021-09-28 DIAGNOSIS — R519 Headache, unspecified: Secondary | ICD-10-CM | POA: Diagnosis not present

## 2021-09-28 DIAGNOSIS — Z0000 Encounter for general adult medical examination without abnormal findings: Secondary | ICD-10-CM

## 2021-09-28 DIAGNOSIS — R7303 Prediabetes: Secondary | ICD-10-CM

## 2021-09-28 DIAGNOSIS — G8929 Other chronic pain: Secondary | ICD-10-CM

## 2021-09-28 LAB — GLUCOSE, POCT (MANUAL RESULT ENTRY): POC Glucose: 125 mg/dl — AB (ref 70–99)

## 2021-09-28 MED ORDER — KETOROLAC TROMETHAMINE 60 MG/2ML IM SOLN
60.0000 mg | Freq: Once | INTRAMUSCULAR | Status: AC
  Administered 2021-09-28: 60 mg via INTRAMUSCULAR

## 2021-09-28 MED ORDER — PROMETHAZINE HCL 25 MG PO TABS: ORAL_TABLET | ORAL | 0 refills | Status: AC

## 2021-09-28 NOTE — Patient Instructions (Addendum)
Make appt with neurology.  If headache worsens at all-go to the ED or call 911

## 2021-09-28 NOTE — Progress Notes (Signed)
Pt presents to establish care 

## 2021-09-28 NOTE — Progress Notes (Signed)
Patient ID: Susan Williamson, female   DOB: 1991-04-17, 30 y.o.   MRN: [id number]    Susan Williamson, is a 30 y.o. female  FGH:[id number]  [id number]:[id number]  DOB - 1991/05/19  Chief Complaint  Patient presents with   Establish Care       Subjective:   Susan Williamson is a 30 y.o. female here today for a follow up visit and to establish care after ED visit 10/25 then by Neurology on 10/27 for intractable HA and problems with insomnia.  MRI was also done and extensive lab work.  She completed the prednisone taper with minimal relief.  No OTC help.  Took a maxalt yesterday with minimal relief.  Has msgs in to neurology.  No other issues or concerns today.  No vision changes and and no new qualities to HA.  HA persistent and waxing and waning in intensity for 1 month.  IMPRESSION/PLAN  [name] is a 30 y.o. female presenting for evaluation of  HEADACHE / FATIGUE / SHORTNESS OF BREATH / NUMBNESS - Worsening. - Patient with headaches, worsening. Currently in headache cycle. Saw UC and ER, received migraine cocktail with no improvement. Had numbness in her mouth with headache. Has not had MRI yet. - Brain Mri without for worsening headaches. - Start Medrol dose pack to break headache cycle: Day 1 take 6 pills Day 2 take 5 pills  Day 3 take 4 pills Day 4 take 3 pills Day 5 take 2 pills Day 6 take 1 pill Done.  - Start taking Maxalt 10 mg at headache onset. Take another tablet in 2 hours if needed. Take no more than 2 tablets in a 24 hour period.  - Can consider preventative headache medication if headache worsen.  DIFFICULTY SLEEPING/SYNCOPE / SEIZURE-LIKE ACTIVITY - Unchanged. - Patient with sleep difficulty. Falling asleep throughout the day, can fall asleep standing up. No longer taking lamictal. - Previous 72 hour EEGs were wnl.  - Schedule for MSLT test to evaluate for narcolepsy. - reviewed ER labs, notable for elevated glucose (45), mildly low Hg and calcium. Otherwise  unremarkable. - Check labs today to evaluate fatigue, headaches: HgA1C, TSH, B12, vitamin D, folate, ferritin, BMP. - Referral to primary care to establish care. Patient prefers provider in [place] if possible but would like first available. - Provided work note for patient recommending accommodations for no driving due to possible narcolepsy until evaluation is complete.  - Can consider sleep medication.      No problems updated.  ALLERGIES: No Known Allergies  PAST MEDICAL HISTORY: History reviewed. No pertinent past medical history.  MEDICATIONS AT HOME: Prior to Admission medications   Medication Sig Start Date End Date Taking? Authorizing Provider  promethazine (PHENERGAN) 25 MG tablet 1/2-1 tablet prn HA or nausea 09/28/21  Yes Jamarkis Branam M, PA-C  metFORMIN (GLUCOPHAGE) 500 MG tablet Take 500 mg by mouth 2 (two) times daily with a meal.    [provider]  YAZ 3-0.02 MG tablet Take 1 tablet by mouth daily. 08/08/21   [provider]    ROS: Neg HEENT Neg resp Neg cardiac Neg GI Neg GU Neg MS  Objective:   Vitals:   09/28/21 0858  BP: 116/78  Pulse: 85  Resp: 18  Temp: 98.3 F (36.8 C)  SpO2: 96%  Weight: 224 lb 6.4 oz (101.8 kg)  Height: 5' 6.54" (1.69 m)   Exam General appearance : Awake, alert, not in any distress. Speech Clear. Not toxic looking HEENT: Atraumatic and Normocephalic,  pupils equally reactive to light and accomodation.  PERRLA.  EOM intact.  Fundi benign Neck: Supple, no JVD. No cervical lymphadenopathy.  Chest: Good air entry bilaterally, CTAB.  No rales/rhonchi/wheezing CVS: S1 S2 regular, no murmurs.  Extremities: B/L Lower Ext shows no edema, both legs are warm to touch Neurology: Awake alert, and oriented X 3, CN II-XII intact, Non focal Skin: No Rash  Data Review No results found for: HGBA1C  Assessment & Plan   1. Prediabetes I have had a lengthy discussion and provided education about insulin  resistance and the intake of too much sugar/refined carbohydrates.  I have advised the patient to work at a goal of eliminating sugary drinks, candy, desserts, sweets, refined sugars, processed foods, and white carbohydrates.  The patient expresses understanding.  Continue metformin/also has PCOS - Glucose (CBG)  2. Chronic intractable headache, unspecified headache type No red flags today.  Follow up with neuro;  to ED if worsens or call 911.  Patient verbalizes understanding - ketorolac (TORADOL) injection 60 mg - promethazine (PHENERGAN) 25 MG tablet; 1/2-1 tablet prn HA or nausea  Dispense: 20 tablet; Refill: 0  3. Encounter for medical examination to establish care    Patient have been counseled extensively about nutrition and exercise. Other issues discussed during this visit include: low cholesterol diet, weight control and daily exercise, foot care, annual eye examinations at Ophthalmology, importance of adherence with medications and regular follow-up. We also discussed long term complications of uncontrolled diabetes and hypertension.   Return in about 3 months (around 12/29/2021) for see Dr Andrey Campanile.  The patient was given clear instructions to go to ER or return to medical center if symptoms don't improve, worsen or new problems develop. The patient verbalized understanding. The patient was told to call to get lab results if they haven't heard anything in the next week.      Georgian Co, PA-C [facility] [place] [facility] [place], [place] [phone]   09/28/2021, 9:44 AM

## 2021-10-19 ENCOUNTER — Encounter: Payer: Self-pay | Admitting: Emergency Medicine

## 2021-10-19 ENCOUNTER — Telehealth (INDEPENDENT_AMBULATORY_CARE_PROVIDER_SITE_OTHER): Payer: 59 | Admitting: Nurse Practitioner

## 2021-10-19 ENCOUNTER — Other Ambulatory Visit: Payer: Self-pay

## 2021-10-19 ENCOUNTER — Encounter: Payer: Self-pay | Admitting: Nurse Practitioner

## 2021-10-19 ENCOUNTER — Ambulatory Visit
Admission: EM | Admit: 2021-10-19 | Discharge: 2021-10-19 | Disposition: A | Discharge status: Home / Self Care | Payer: 59 | Attending: Physician Assistant | Admitting: Physician Assistant

## 2021-10-19 DIAGNOSIS — J069 Acute upper respiratory infection, unspecified: Secondary | ICD-10-CM

## 2021-10-19 DIAGNOSIS — R6889 Other general symptoms and signs: Secondary | ICD-10-CM

## 2021-10-19 MED ORDER — IBUPROFEN 800 MG PO TABS
800.0000 mg | ORAL_TABLET | Freq: Once | ORAL | Status: AC
  Administered 2021-10-19: 20:00:00 800 mg via ORAL

## 2021-10-19 MED ORDER — OSELTAMIVIR PHOSPHATE 75 MG PO CAPS: 75.0000 mg | ORAL_CAPSULE | Freq: Two times a day (BID) | ORAL | 0 refills | Status: AC

## 2021-10-19 NOTE — Progress Notes (Signed)
Virtual Visit via Telephone Note  I connected with Susan Williamson on 10/19/21 at  1:40 PM EST by telephone and verified that I am speaking with the correct person using two identifiers.  Location: Patient: home Provider: office   I discussed the limitations, risks, security and privacy concerns of performing an evaluation and management service by telephone and the availability of in person appointments. I also discussed with the patient that there may be a patient responsible charge related to this service. The patient expressed understanding and agreed to proceed.   History of Present Illness:  Patient presents today for telephone visit/sick visit.  Patient states that her symptoms started this morning.  She states that she woke up with a temperature of 100.3 this morning.  She states that she has been having headache, fatigue, sore throat. Denies f/c/s, n/v/d, hemoptysis, PND, chest pain or edema.     Observations/Objective:  Vitals with BMI 09/28/2021 08/31/2021 08/30/2021  Height 5' 6.535" - 5\' 7"   Weight 224 lbs 6 oz - 209 lbs  BMI 35.64 - 32.73  Systolic 116 148  Diastolic 78 106 99  Pulse 85 71 78      Assessment and Plan:  URI:   Stay well hydrated  Stay active  Deep breathing exercises  May take tylenol for fever or pain  Will order respiratory panel - patient may come by the office this afternoon to have this completed   Follow up:  Follow up if needed     I discussed the assessment and treatment plan with the patient. The patient was provided an opportunity to ask questions and all were answered. The patient agreed with the plan and demonstrated an understanding of the instructions.   The patient was advised to call back or seek an in-person evaluation if the symptoms worsen or if the condition fails to improve as anticipated.  I provided 23 minutes of non-face-to-face time during this encounter.   381, NP

## 2021-10-19 NOTE — ED Triage Notes (Addendum)
Fever all day, body aches, headache, fatigue, sore throat. Denies cough, nasal congestion. Took 1 gram tylenol around 6:30 this afternoon. Seen in primary care this morning, swabbed for flu, covid, RSV.

## 2021-10-19 NOTE — Patient Instructions (Addendum)
URI:   Stay well hydrated  Stay active  Deep breathing exercises  May take tylenol for fever or pain  Will order respiratory panel - patient may come by the office this afternoon to have this completed   Follow up:  Follow up if needed

## 2021-10-20 LAB — COVID-19, FLU A+B AND RSV
Influenza A, NAA: NOT DETECTED
Influenza B, NAA: NOT DETECTED
RSV, NAA: NOT DETECTED
SARS-CoV-2, NAA: NOT DETECTED

## 2021-10-20 NOTE — ED Provider Notes (Signed)
[facility]    CSN: [id number] Arrival date & time: 10/19/21  1904      History   Chief Complaint No chief complaint on file.   HPI Susan Williamson is a 30 y.o. female.   Patient here today for evaluation of fever, congestion, body aches, sore throat and cough that started this morning.  They were seen at their primary care office earlier this morning and were screened for COVID, flu and RSV but results are still pending.  Fever has continued despite using Tylenol.  The history is provided by the patient.   History reviewed. No pertinent past medical history.  Patient Active Problem List   Diagnosis Date Noted   LOC (loss of consciousness) (HCC) 11/18/2018   Seizure-like activity (HCC) 11/18/2018   Sleep difficulties 11/18/2018   Health care maintenance 01/04/2012    Past Surgical History:  Procedure Laterality Date   EYE SURGERY     skull surgery      OB History   No obstetric history on file.      Home Medications    Prior to Admission medications   Medication Sig Start Date End Date Taking? Authorizing Provider  oseltamivir (TAMIFLU) 75 MG capsule Take 1 capsule (75 mg total) by mouth every 12 (twelve) hours. 10/19/21  Yes Tomi Bamberger, PA-C  metFORMIN (GLUCOPHAGE) 500 MG tablet Take 500 mg by mouth 2 (two) times daily with a meal.    [provider]  promethazine (PHENERGAN) 25 MG tablet 1/2-1 tablet prn HA or nausea 09/28/21   Georgian Co M, PA-C  YAZ 3-0.02 MG tablet Take 1 tablet by mouth daily. 08/08/21   [provider]    Family History Family History  Problem Relation Age of Onset   Hypertension Mother    Migraines Mother    Epilepsy Mother     Social History Social History   Tobacco Use   Smoking status: Never   Smokeless tobacco: Never  Vaping Use   Vaping Use: Never used  Substance Use Topics   Alcohol use: Yes    Comment: minimal   Drug use: No     Allergies   Patient has no known  allergies.   Review of Systems Review of Systems  Constitutional:  Positive for chills and fever.  HENT:  Positive for congestion, sinus pressure and sore throat. Negative for ear pain.   Eyes:  Negative for discharge and redness.  Respiratory:  Positive for cough. Negative for shortness of breath and wheezing.   Gastrointestinal:  Negative for abdominal pain, diarrhea, nausea and vomiting.  Genitourinary:  Positive for vaginal bleeding and vaginal discharge.    Physical Exam Triage Vital Signs ED Triage Vitals  Enc Vitals Group     BP 10/19/21 1936 126/78     Pulse Rate 10/19/21 1936 (!) 119     Resp 10/19/21 1936 16     Temp 10/19/21 1936 (!) 102.2 F (39 C)     Temp Source 10/19/21 1936 Oral     SpO2 10/19/21 1936 96 %     Weight --      Height --      Head Circumference --      Peak Flow --      Pain Score 10/19/21 1939 7     Pain Loc --      Pain Edu? --      Excl. in GC? --    No data found.  Updated Vital Signs BP 126/78 (BP  Location: Left Arm)    Pulse (!) 119    Temp (!) 102.2 F (39 C) (Oral)    Resp 16    SpO2 96%      Physical Exam Vitals and nursing note reviewed.  Constitutional:      General: She is not in acute distress.    Appearance: Normal appearance. She is not ill-appearing.  HENT:     Head: Normocephalic and atraumatic.     Nose: Congestion present.  Eyes:     Conjunctiva/sclera: Conjunctivae normal.  Cardiovascular:     Rate and Rhythm: Normal rate.  Pulmonary:     Effort: Pulmonary effort is normal.  Neurological:     Mental Status: She is alert.  Psychiatric:        Mood and Affect: Mood normal.        Behavior: Behavior normal.        Thought Content: Thought content normal.     UC Treatments / Results  Labs (all labs ordered are listed, but only abnormal results are displayed) Labs Reviewed - No data to display  EKG   Radiology No results found.  Procedures Procedures (including critical care time)  Medications  Ordered in UC Medications  ibuprofen (ADVIL) tablet 800 mg (800 mg Oral Given 10/19/21 2009)    Initial Impression / Assessment and Plan / UC Course  I have reviewed the triage vital signs and the nursing notes.  Pertinent labs & imaging results that were available during my care of the patient were reviewed by me and considered in my medical decision making (see chart for details).  Suspect most likely influenza given high fever.  Ibuprofen administered in office and recommended alternating ibuprofen and Tylenol at home.  Tamiflu also prescribed.  Otherwise recommended awaiting results from PCP.  Encouraged follow-up with any further concerns.  Final Clinical Impressions(s) / UC Diagnoses   Final diagnoses:  Flu-like symptoms   Discharge Instructions   None    ED Prescriptions     Medication Sig Dispense Auth. Provider   oseltamivir (TAMIFLU) 75 MG capsule Take 1 capsule (75 mg total) by mouth every 12 (twelve) hours. 10 capsule Tomi Bamberger, PA-C      PDMP not reviewed this encounter.   Tomi Bamberger, PA-C 10/20/21 [phone]

## 2021-10-24 NOTE — Progress Notes (Signed)
Patient ID: Susan Williamson, female    DOB: 12/13/1990  MRN: [id number]  CC: Sore Throat   Subjective: Susan Williamson is a 30 y.o. female who presents for sore throat.   Her concerns today include:  10/19/2021 at [facility] per NP note: Stay well hydrated Stay active Deep breathing exercises May take tylenol for fever or pain Will order respiratory panel - patient may come by the office this afternoon to have this completed   10/19/2021 at [facility] at [facility] per PA note: Suspect most likely influenza given high fever.  Ibuprofen administered in office and recommended alternating ibuprofen and Tylenol at home.  Tamiflu also prescribed.  Otherwise recommended awaiting results from PCP.  Encouraged follow-up with any further concerns.  10/25/2021: Sore throat began 10/19/2021 and persisting since then. Cough productive of blood-streaked phlegm. Endorses headache, spiky tightness of throat, throbbing throat, and ears feel on fire. Recently lost voice which has improved some. Denies shortness of breath, chest pain, fever, and additional red flag symptoms. Taking over-the counter Dayquil, Ibuprofen, Acetaminophen, and completed prescription of Tamiflu recently prescribed at the Urgent Care.   Patient Active Problem List   Diagnosis Date Noted   LOC (loss of consciousness) (HCC) 11/18/2018   Seizure-like activity (HCC) 11/18/2018   Sleep difficulties 11/18/2018   Health care maintenance 01/04/2012     Current Outpatient Medications on File Prior to Visit  Medication Sig Dispense Refill   metFORMIN (GLUCOPHAGE) 500 MG tablet Take 500 mg by mouth 2 (two) times daily with a meal.     oseltamivir (TAMIFLU) 75 MG capsule Take 1 capsule (75 mg total) by mouth every 12 (twelve) hours. 10 capsule 0   promethazine (PHENERGAN) 25 MG tablet 1/2-1 tablet prn HA or nausea 20 tablet 0   YAZ 3-0.02 MG tablet Take 1 tablet by mouth daily.     No  current facility-administered medications on file prior to visit.    No Known Allergies  Social History   Socioeconomic History   Marital status: Single    Spouse name: Not on file   Number of children: Not on file   Years of education: Not on file   Highest education level: Not on file  Occupational History   Not on file  Tobacco Use   Smoking status: Never   Smokeless tobacco: Never  Vaping Use   Vaping Use: Never used  Substance and Sexual Activity   Alcohol use: Yes    Comment: minimal   Drug use: No   Sexual activity: Yes    Birth control/protection: Pill  Other Topics Concern   Not on file  Social History Narrative   Not on file   Social Determinants of Health   Financial Resource Strain: Not on file  Food Insecurity: Not on file  Transportation Needs: Not on file  Physical Activity: Not on file  Stress: Not on file  Social Connections: Not on file  Intimate Partner Violence: Not on file    Family History  Problem Relation Age of Onset   Hypertension Mother    Migraines Mother    Epilepsy Mother     Past Surgical History:  Procedure Laterality Date   EYE SURGERY     skull surgery      ROS: Review of Systems Negative except as stated above  PHYSICAL EXAM: BP 127/88 (BP Location: Left Arm, Patient Position: Sitting, Cuff Size: Large)    Pulse 90  Temp 98.3 F (36.8 C)    Resp 18    Ht 5' 6.54" (1.69 m)    Wt 224 lb (101.6 kg)    SpO2 95%    BMI 35.58 kg/m   Physical Exam HENT:     Head: Normocephalic and atraumatic.     Right Ear: Tympanic membrane and ear canal normal.     Left Ear: Tympanic membrane and ear canal normal.     Mouth/Throat:     Mouth: Mucous membranes are moist.     Pharynx: Oropharynx is clear. Uvula midline.  Eyes:     Conjunctiva/sclera: Conjunctivae normal.     Pupils: Pupils are equal, round, and reactive to light.  Cardiovascular:     Rate and Rhythm: Normal rate and regular rhythm.     Heart sounds: Normal  heart sounds.  Pulmonary:     Effort: Pulmonary effort is normal.     Breath sounds: Normal breath sounds.  Musculoskeletal:     Cervical back: Normal range of motion and neck supple.  Neurological:     General: No focal deficit present.     Mental Status: She is alert and oriented to person, place, and time.  Psychiatric:        Mood and Affect: Mood normal.        Behavior: Behavior normal.   Results for orders placed or performed in visit on 10/25/21  POCT rapid strep A  Result Value Ref Range   Rapid Strep A Screen Negative Negative    ASSESSMENT AND PLAN: 1. Acute non-recurrent frontal sinusitis: 2. Acute ear pain, bilateral: - Amoxicillin-Clavulanate as prescribed.  - Follow-up with primary provider as scheduled. - amoxicillin-clavulanate (AUGMENTIN) 875-125 MG tablet; Take 1 tablet by mouth 2 (two) times daily for 10 days.  Dispense: 20 tablet; Refill: 0  3. Acute cough: - Covid, Flu, RSV negative on 10/19/2021. - Diagnostic chest x-ray for further evaluation.  - Benzonatate capsules as prescribed.  - Follow-up with primary provider as scheduled. - DG Chest 2 View; Future - benzonatate (TESSALON) 100 MG capsule; Take 1 capsule (100 mg total) by mouth 3 (three) times daily as needed for cough.  Dispense: 20 capsule; Refill: 0  4. Sore throat: - Rapid Strep A, negative. Sending culture for further evaluation.  - Culture, Group A Strep - POCT rapid strep A    Patient was given the opportunity to ask questions.  Patient verbalized understanding of the plan and was able to repeat key elements of the plan. Patient was given clear instructions to go to Emergency Department or return to medical center if symptoms don't improve, worsen, or new problems develop.The patient verbalized understanding.   Orders Placed This Encounter  Procedures   Culture, Group A Strep   DG Chest 2 View   POCT rapid strep A     Requested Prescriptions   Signed Prescriptions Disp Refills    amoxicillin-clavulanate (AUGMENTIN) 875-125 MG tablet 20 tablet 0    Sig: Take 1 tablet by mouth 2 (two) times daily for 10 days.   benzonatate (TESSALON) 100 MG capsule 20 capsule 0    Sig: Take 1 capsule (100 mg total) by mouth 3 (three) times daily as needed for cough.    Follow-up with primary provider as scheduled.  Rema Fendt, NP

## 2021-10-25 ENCOUNTER — Ambulatory Visit (INDEPENDENT_AMBULATORY_CARE_PROVIDER_SITE_OTHER): Payer: 59

## 2021-10-25 ENCOUNTER — Ambulatory Visit: Payer: 59 | Admitting: Family Medicine

## 2021-10-25 ENCOUNTER — Other Ambulatory Visit: Payer: Self-pay

## 2021-10-25 ENCOUNTER — Encounter: Payer: Self-pay | Admitting: Family

## 2021-10-25 ENCOUNTER — Ambulatory Visit (INDEPENDENT_AMBULATORY_CARE_PROVIDER_SITE_OTHER): Payer: 59 | Admitting: Family

## 2021-10-25 VITALS — BP 127/88 | HR 90 | Temp 98.3°F | Resp 18 | Ht 66.54 in | Wt 224.0 lb

## 2021-10-25 DIAGNOSIS — R051 Acute cough: Secondary | ICD-10-CM

## 2021-10-25 DIAGNOSIS — J0110 Acute frontal sinusitis, unspecified: Secondary | ICD-10-CM

## 2021-10-25 DIAGNOSIS — H9203 Otalgia, bilateral: Secondary | ICD-10-CM | POA: Diagnosis not present

## 2021-10-25 DIAGNOSIS — J029 Acute pharyngitis, unspecified: Secondary | ICD-10-CM | POA: Diagnosis not present

## 2021-10-25 LAB — POCT RAPID STREP A (OFFICE): Rapid Strep A Screen: NEGATIVE

## 2021-10-25 IMAGING — DX DG CHEST 2V
2 series · 2 of 2 positions shown · non-contrast
Comparison: None.

CLINICAL DATA: Cough and fever starting 12-14.

EXAM:
CHEST - 2 VIEW

[chest pa]
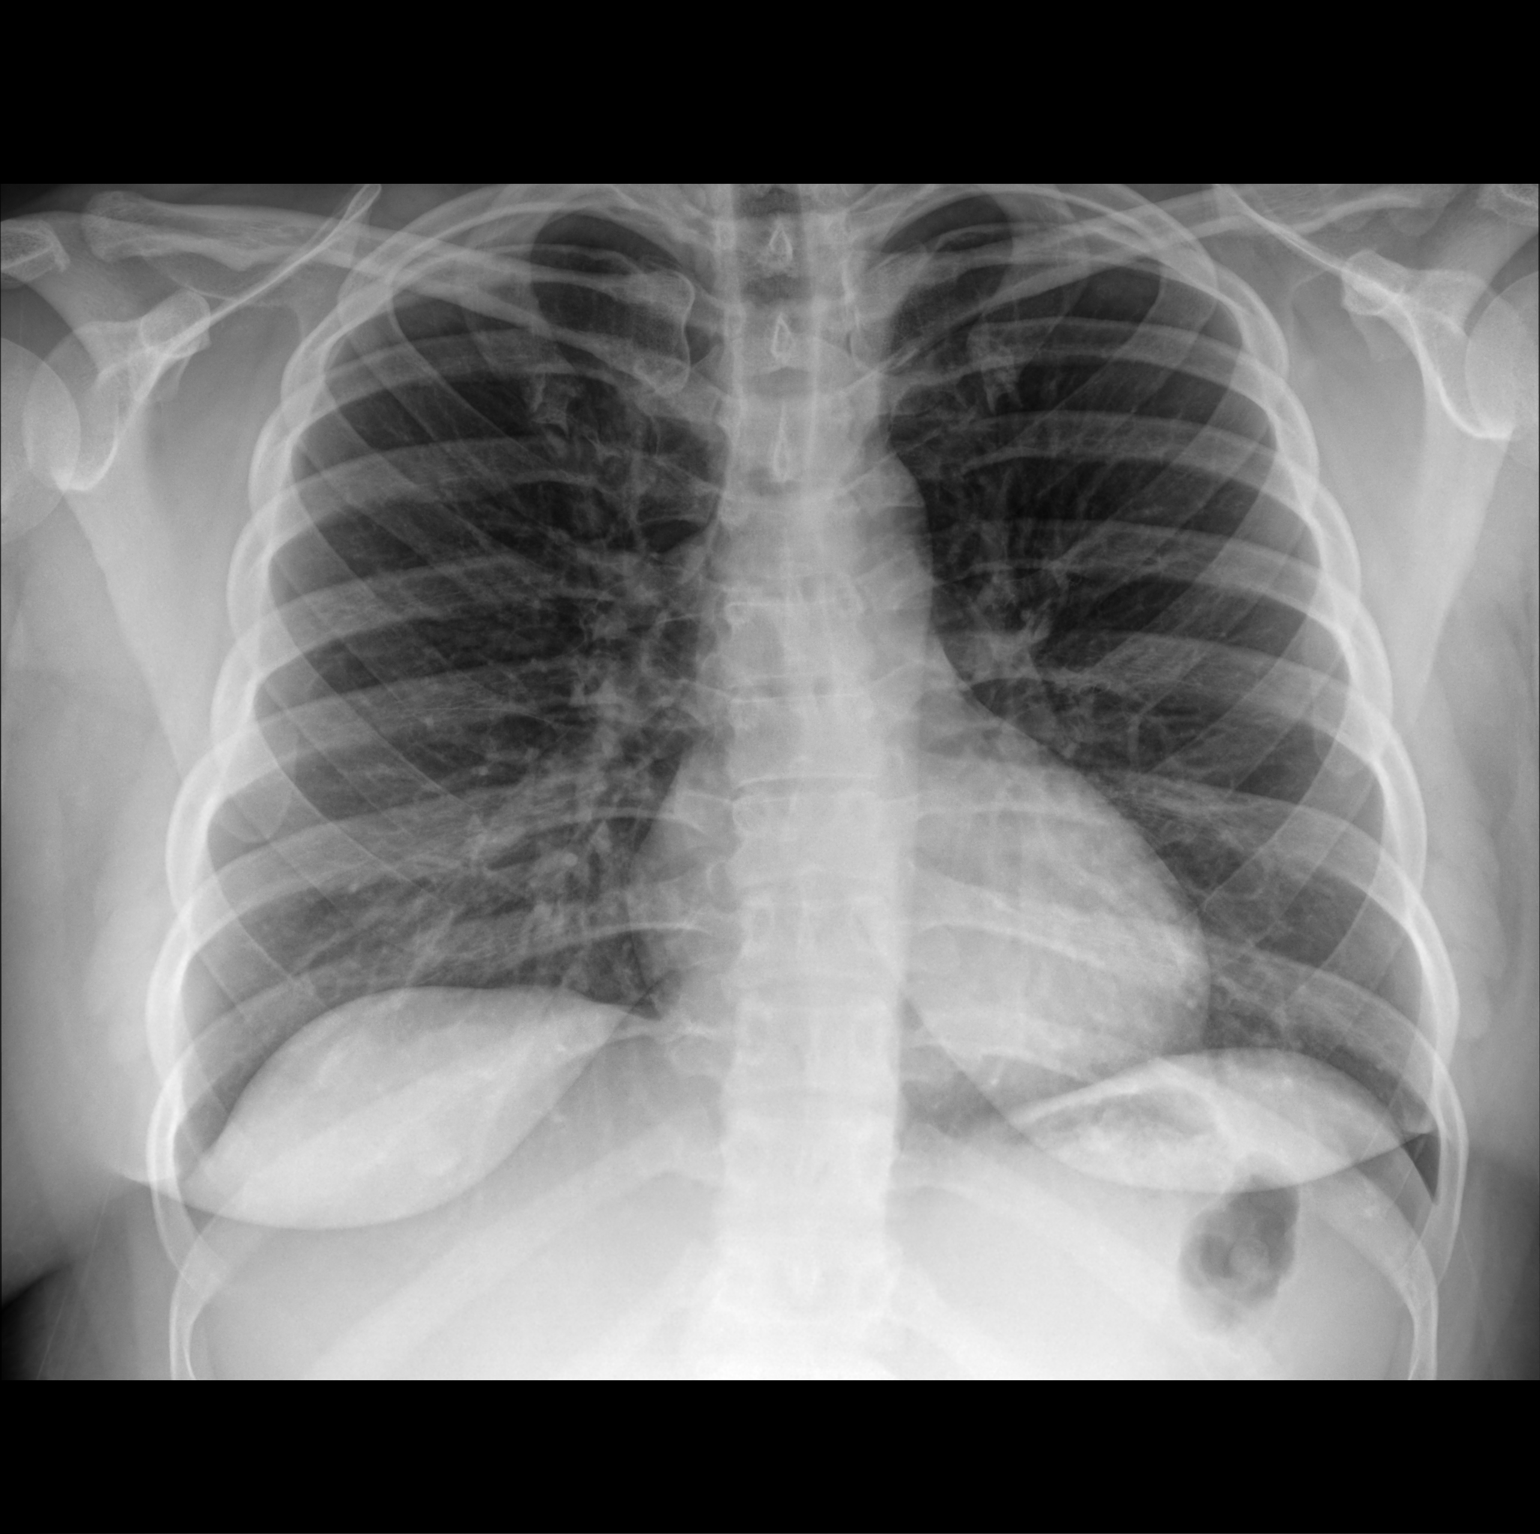

[chest lat]
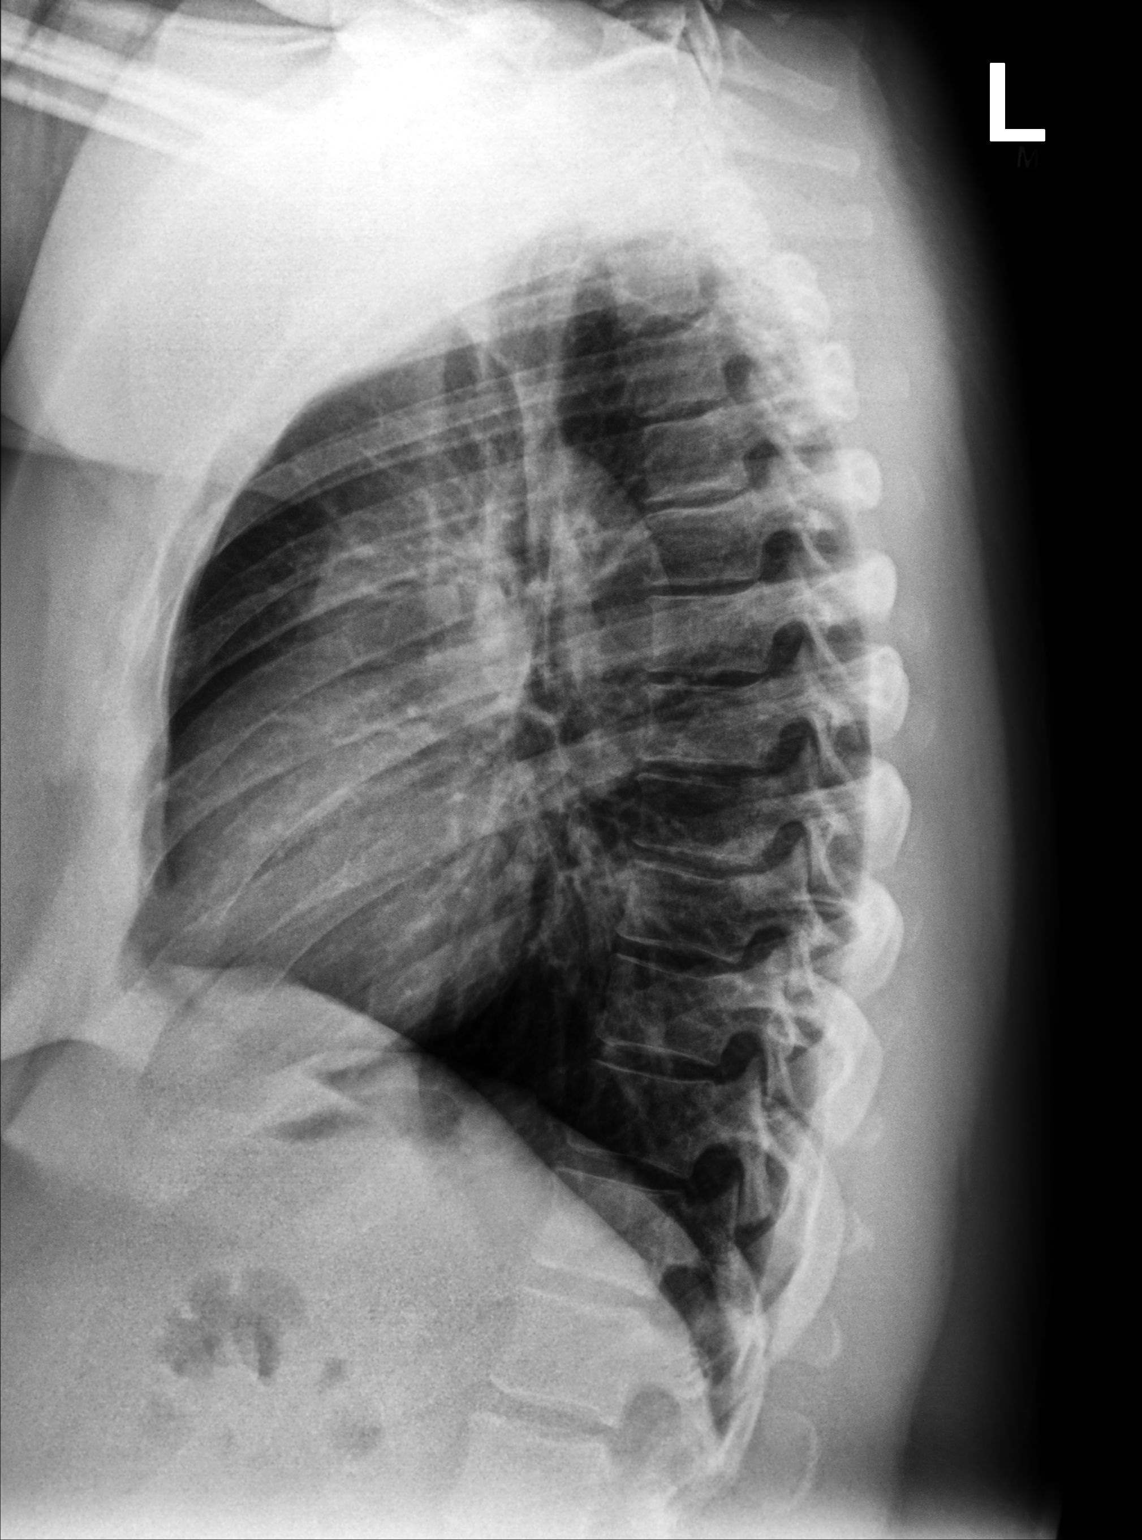

[2 of 2 positions shown; findings below may reference images not displayed]

FINDINGS: Mild pectus excavatum deformity.

Midline trachea.  Normal heart size and mediastinal contours.

Sharp costophrenic angles.  No pneumothorax.  Clear lungs.
IMPRESSION: No active cardiopulmonary disease.

## 2021-10-25 MED ORDER — BENZONATATE 100 MG PO CAPS: 100.0000 mg | ORAL_CAPSULE | Freq: Three times a day (TID) | ORAL | 0 refills | Status: DC | PRN

## 2021-10-25 MED ORDER — AMOXICILLIN-POT CLAVULANATE 875-125 MG PO TABS: 1.0000 | ORAL_TABLET | Freq: Two times a day (BID) | ORAL | 0 refills | Status: AC

## 2021-10-25 NOTE — Progress Notes (Signed)
Pt presents to establish care pt complains of sore throat, pounding headache, green phlegm, dry cough

## 2021-10-25 NOTE — Progress Notes (Signed)
Heart normal size. Lungs clear.

## 2021-10-25 NOTE — Progress Notes (Signed)
Rapid Strep A, negative.

## 2021-10-25 NOTE — Patient Instructions (Signed)

## 2021-10-28 LAB — CULTURE, GROUP A STREP: Strep A Culture: NEGATIVE

## 2021-10-31 NOTE — Progress Notes (Signed)
Strep A culture negative.

## 2021-11-01 ENCOUNTER — Ambulatory Visit: Payer: 59 | Admitting: Family Medicine

## 2021-11-14 ENCOUNTER — Other Ambulatory Visit: Payer: Self-pay

## 2021-11-14 ENCOUNTER — Ambulatory Visit (INDEPENDENT_AMBULATORY_CARE_PROVIDER_SITE_OTHER): Payer: 59 | Admitting: Nurse Practitioner

## 2021-11-14 ENCOUNTER — Encounter: Payer: Self-pay | Admitting: Nurse Practitioner

## 2021-11-14 DIAGNOSIS — J069 Acute upper respiratory infection, unspecified: Secondary | ICD-10-CM | POA: Diagnosis not present

## 2021-11-14 MED ORDER — PROMETHAZINE-DM 6.25-15 MG/5ML PO SYRP: 5.0000 mL | ORAL_SOLUTION | Freq: Four times a day (QID) | ORAL | 0 refills | Status: AC | PRN

## 2021-11-14 MED ORDER — PREDNISONE 20 MG PO TABS: 20.0000 mg | ORAL_TABLET | Freq: Every day | ORAL | 0 refills | Status: AC

## 2021-11-14 MED ORDER — BENZONATATE 100 MG PO CAPS: 100.0000 mg | ORAL_CAPSULE | Freq: Two times a day (BID) | ORAL | 0 refills | Status: AC | PRN

## 2021-11-14 MED ORDER — AZITHROMYCIN 250 MG PO TABS: ORAL_TABLET | ORAL | 0 refills | Status: AC

## 2021-11-14 NOTE — Progress Notes (Signed)
Virtual Visit via Telephone Note  I connected with Susan Williamson on 11/14/21 at 10:20 AM EST by telephone and verified that I am speaking with the correct person using two identifiers.  Location: Patient: home Provider: office   I discussed the limitations, risks, security and privacy concerns of performing an evaluation and management service by telephone and the availability of in person appointments. I also discussed with the patient that there may be a patient responsible charge related to this service. The patient expressed understanding and agreed to proceed.   History of Present Illness:  Patient presents today for sick visit through telephone visit.  Patient was seen by me on 10/19/2021 for flulike symptoms.  Respiratory panel returned negative.  Patient was seen in the ED on 10/19/2021 and was prescribed Tamiflu before results of respiratory panel were available.  Patient was then seen by Durene Fruits on 10/25/2021.  She was prescribed Augmentin and Tessalon Perles.  Patient states that she still continues to have ongoing cough, sore throat, irritation to bilateral ears, productive thick sputum. Denies f/c/s, n/v/d, hemoptysis, PND, chest pain or edema.     Observations/Objective:  Vitals with BMI 10/25/2021 10/19/2021 09/28/2021  Height 5' 6.535" - 5' 6.535"  Weight 224 lbs - 224 lbs 6 oz  BMI [id number]  Systolic [id number]  Diastolic 88 78 78  Pulse 90 119 85      Assessment and Plan:  URI Cough:   Stay well hydrated  Stay active  Deep breathing exercises  May take tylenol for fever or pain  May take mucinex DM twice daily  Will order Augmentin  Will order prednisone  Will reorder tessalon perles to use during the day  Will order cough syrup for night   Follow up:  Follow up in 2 weeks or sooner if needed    I discussed the assessment and treatment plan with the patient. The patient was provided an opportunity to ask questions and all were  answered. The patient agreed with the plan and demonstrated an understanding of the instructions.   The patient was advised to call back or seek an in-person evaluation if the symptoms worsen or if the condition fails to improve as anticipated.  I provided 23 minutes of non-face-to-face time during this encounter.   Fenton Foy, NP

## 2021-11-14 NOTE — Patient Instructions (Signed)
URI Cough:   Stay well hydrated  Stay active  Deep breathing exercises  May take tylenol for fever or pain  May take mucinex DM twice daily  Will order Augmentin  Will order prednisone  Will reorder tessalon perles to use during the day  Will order cough syrup for night   Follow up:  Follow up in 2 weeks or sooner if needed

## 2021-12-26 ENCOUNTER — Ambulatory Visit: Payer: 59 | Admitting: Family Medicine

## 2021-12-28 ENCOUNTER — Ambulatory Visit: Payer: 59 | Attending: Otolaryngology

## 2021-12-28 DIAGNOSIS — R5383 Other fatigue: Secondary | ICD-10-CM | POA: Diagnosis present

## 2021-12-28 DIAGNOSIS — G4733 Obstructive sleep apnea (adult) (pediatric): Secondary | ICD-10-CM | POA: Insufficient documentation

## 2021-12-29 ENCOUNTER — Ambulatory Visit: Payer: 59

## 2021-12-30 ENCOUNTER — Other Ambulatory Visit: Payer: Self-pay

## 2022-02-23 ENCOUNTER — Encounter: Payer: Self-pay | Admitting: Family

## 2022-02-23 NOTE — Telephone Encounter (Signed)
Call patient with update.  ? 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 Patient currently followed by Theora Master, MD at [facility]). Recommendation to request letter and leave from work regarding the same.

## 2022-03-04 NOTE — Progress Notes (Signed)
Erroneous encounter

## 2022-03-10 ENCOUNTER — Encounter: Payer: 59 | Admitting: Family

## 2023-01-26 ENCOUNTER — Ambulatory Visit
Admission: EM | Admit: 2023-01-26 | Discharge: 2023-01-26 | Disposition: A | Discharge status: Home / Self Care | Payer: Self-pay | Attending: Physician Assistant | Admitting: Physician Assistant

## 2023-01-26 DIAGNOSIS — K047 Periapical abscess without sinus: Secondary | ICD-10-CM

## 2023-01-26 DIAGNOSIS — B349 Viral infection, unspecified: Secondary | ICD-10-CM

## 2023-01-26 MED ORDER — ACETAMINOPHEN 325 MG PO TABS
650.0000 mg | ORAL_TABLET | Freq: Once | ORAL | Status: AC
  Administered 2023-01-26: 650 mg via ORAL

## 2023-01-26 MED ORDER — CLINDAMYCIN HCL 300 MG PO CAPS: 300.0000 mg | ORAL_CAPSULE | Freq: Three times a day (TID) | ORAL | 0 refills | Status: AC

## 2023-01-26 NOTE — ED Triage Notes (Signed)
Pt presents with c/o chills and bopdyaches x 1200. States she is concerned of a dental abscess. Left side of cheek is swollen and tooth is sore. Pt states she had a temp of 100.7.

## 2023-01-26 NOTE — Discharge Instructions (Signed)
Tylenol every 4 hours for fever.  Take antibiotics as directed

## 2023-01-29 NOTE — ED Provider Notes (Signed)
[facility]    CSN: [id number] Arrival date & time: 01/26/23  1358      History   Chief Complaint Chief Complaint  Patient presents with   Dental Pain   Fever   Chills    HPI Susan Williamson is a 32 y.o. female.   Pt complains of swelling to her mouth.  Pt is concerned about dental infection   The history is provided by the patient. No language interpreter was used.  Dental Pain Quality:  Aching Severity:  Moderate Progression:  Worsening Chronicity:  New Worsened by:  Nothing Associated symptoms: fever   Fever   History reviewed. No pertinent past medical history.  Patient Active Problem List   Diagnosis Date Noted   LOC (loss of consciousness) ([place]) 11/18/2018   Seizure-like activity ([place]) 11/18/2018   Sleep difficulties 11/18/2018   Health care maintenance 01/04/2012    Past Surgical History:  Procedure Laterality Date   EYE SURGERY     skull surgery      OB History   No obstetric history on file.      Home Medications    Prior to Admission medications   Medication Sig Start Date End Date Taking? Authorizing Provider  clindamycin (CLEOCIN) 300 MG capsule Take 1 capsule (300 mg total) by mouth 3 (three) times daily for 10 days. 01/26/23 02/05/23 Yes Caryl Ada K, PA-C  benzonatate (TESSALON) 100 MG capsule Take 1 capsule (100 mg total) by mouth 2 (two) times daily as needed for cough. 11/14/21   Fenton Foy, NP  metFORMIN (GLUCOPHAGE) 500 MG tablet Take 500 mg by mouth 2 (two) times daily with a meal.    [provider]  oseltamivir (TAMIFLU) 75 MG capsule Take 1 capsule (75 mg total) by mouth every 12 (twelve) hours. 10/19/21   Francene Finders, PA-C  promethazine (PHENERGAN) 25 MG tablet 1/2-1 tablet prn HA or nausea 09/28/21   Argentina Donovan, PA-C  promethazine-dextromethorphan (PROMETHAZINE-DM) 6.25-15 MG/5ML syrup Take 5 mLs by mouth 4 (four) times daily as needed for cough. 11/14/21   Fenton Foy, NP  YAZ 3-0.02  MG tablet Take 1 tablet by mouth daily. 08/08/21   [provider]    Family History Family History  Problem Relation Age of Onset   Hypertension Mother    Migraines Mother    Epilepsy Mother     Social History Social History   Tobacco Use   Smoking status: Never   Smokeless tobacco: Never  Vaping Use   Vaping Use: Never used  Substance Use Topics   Alcohol use: Yes    Comment: minimal   Drug use: No     Allergies   Patient has no known allergies.   Review of Systems Review of Systems  Constitutional:  Positive for fever.  All other systems reviewed and are negative.    Physical Exam Triage Vital Signs ED Triage Vitals  Enc Vitals Group     BP 01/26/23 1544 137/83     Pulse Rate 01/26/23 1544 98     Resp 01/26/23 1544 18     Temp 01/26/23 1544 (!) 102.4 F (39.1 C)     Temp Source 01/26/23 1544 Oral     SpO2 01/26/23 1544 98 %     Weight --      Height --      Head Circumference --      Peak Flow --      Pain Score 01/26/23 1543 10  Pain Loc --      Pain Edu? --      Excl. in [place]? --    No data found.  Updated Vital Signs BP 137/83 (BP Location: Right Arm)   Pulse 98   Temp 99.5 F (37.5 C) (Oral)   Resp 18   LMP 01/03/2023 (Exact Date)   SpO2 98%   Visual Acuity Right Eye Distance:   Left Eye Distance:   Bilateral Distance:    Right Eye Near:   Left Eye Near:    Bilateral Near:     Physical Exam Vitals reviewed.  Constitutional:      Appearance: Normal appearance.  HENT:     Nose: Nose normal.     Mouth/Throat:     Mouth: Mucous membranes are moist.  Skin:    General: Skin is warm.  Neurological:     General: No focal deficit present.     Mental Status: She is alert.  Psychiatric:        Mood and Affect: Mood normal.      UC Treatments / Results  Labs (all labs ordered are listed, but only abnormal results are displayed) Labs Reviewed - No data to display  EKG   Radiology No results  found.  Procedures Procedures (including critical care time)  Medications Ordered in UC Medications  acetaminophen (TYLENOL) tablet 650 mg (650 mg Oral Given 01/26/23 1550)    Initial Impression / Assessment and Plan / UC Course  I have reviewed the triage vital signs and the nursing notes.  Pertinent labs & imaging results that were available during my care of the patient were reviewed by me and considered in my medical decision making (see chart for details).      Final Clinical Impressions(s) / UC Diagnoses   Final diagnoses:  Dental abscess  Viral illness     Discharge Instructions      Tylenol every 4 hours for fever.  Take antibiotics as directed    ED Prescriptions     Medication Sig Dispense Auth. Provider   clindamycin (CLEOCIN) 300 MG capsule Take 1 capsule (300 mg total) by mouth 3 (three) times daily for 10 days. 30 capsule Fransico Meadow, [place]      PDMP not reviewed this encounter.   Fransico Meadow, [place] 01/29/23 1029

## 2023-02-24 ENCOUNTER — Encounter (HOSPITAL_BASED_OUTPATIENT_CLINIC_OR_DEPARTMENT_OTHER): Payer: Self-pay | Admitting: Emergency Medicine

## 2023-02-24 ENCOUNTER — Emergency Department (HOSPITAL_BASED_OUTPATIENT_CLINIC_OR_DEPARTMENT_OTHER)
Admission: EM | Admit: 2023-02-24 | Discharge: 2023-02-24 | Disposition: A | Discharge status: Home / Self Care | Payer: Self-pay | Attending: Emergency Medicine | Admitting: Emergency Medicine

## 2023-02-24 DIAGNOSIS — M79645 Pain in left finger(s): Secondary | ICD-10-CM | POA: Insufficient documentation

## 2023-02-24 MED ORDER — LIDOCAINE-EPINEPHRINE (PF) 2 %-1:200000 IJ SOLN
10.0000 mL | Freq: Once | INTRAMUSCULAR | Status: AC
  Administered 2023-02-24: 10 mL via INTRADERMAL
  Filled 2023-02-24: qty 20

## 2023-02-24 NOTE — ED Provider Notes (Signed)
[facility] Provider Note   CSN: [id number] Arrival date & time: 02/24/23  1730     History  Chief Complaint  Patient presents with   Ring Stuck on Finger    Susan Williamson is a 32 y.o. female.  32 yo F with a chief complaints of not being able to get her ring off of her left ring finger.  This been going on since about 2 AM.  She has tried multiple times to try and hold off but without success.  She feels like since then it has been getting more swollen.  She went to the fire department and have them try also without success and she is here now to have it removed.  She denies any initial injury to the finger.  She felt that ring was very snug at baseline.        Home Medications Prior to Admission medications   Medication Sig Start Date End Date Taking? Authorizing Provider  benzonatate (TESSALON) 100 MG capsule Take 1 capsule (100 mg total) by mouth 2 (two) times daily as needed for cough. 11/14/21   Ivonne Andrew, NP  metFORMIN (GLUCOPHAGE) 500 MG tablet Take 500 mg by mouth 2 (two) times daily with a meal.    [provider]  oseltamivir (TAMIFLU) 75 MG capsule Take 1 capsule (75 mg total) by mouth every 12 (twelve) hours. 10/19/21   Tomi Bamberger, PA-C  promethazine (PHENERGAN) 25 MG tablet 1/2-1 tablet prn HA or nausea 09/28/21   Anders Simmonds, PA-C  promethazine-dextromethorphan (PROMETHAZINE-DM) 6.25-15 MG/5ML syrup Take 5 mLs by mouth 4 (four) times daily as needed for cough. 11/14/21   Ivonne Andrew, NP  YAZ 3-0.02 MG tablet Take 1 tablet by mouth daily. 08/08/21   [provider]      Allergies    Patient has no known allergies.    Review of Systems   Review of Systems  Physical Exam Updated Vital Signs BP (!) 133/92 (BP Location: Left Arm)   Pulse 76   Temp 98.1 F (36.7 C) (Oral)   Resp 18   Ht  (1.676 m)   Wt 108.9 kg   LMP 02/18/2023 (Exact Date)   SpO2 94%   BMI 38.74 kg/m   Physical Exam Vitals and nursing note reviewed.  Constitutional:      General: She is not in acute distress.    Appearance: She is well-developed. She is not diaphoretic.  HENT:     Head: Normocephalic and atraumatic.  Eyes:     Pupils: Pupils are equal, round, and reactive to light.  Cardiovascular:     Rate and Rhythm: Normal rate and regular rhythm.     Heart sounds: No murmur heard.    No friction rub. No gallop.  Pulmonary:     Effort: Pulmonary effort is normal.     Breath sounds: No wheezing or rales.  Abdominal:     General: There is no distension.     Palpations: Abdomen is soft.     Tenderness: There is no abdominal tenderness.  Musculoskeletal:        General: No tenderness.     Cervical back: Normal range of motion and neck supple.  Skin:    General: Skin is warm and dry.  Neurological:     Mental Status: She is alert and oriented to person, place, and time.  Psychiatric:        Behavior: Behavior normal.  ED Results / Procedures / Treatments   Labs (all labs ordered are listed, but only abnormal results are displayed) Labs Reviewed - No data to display  EKG None  Radiology No results found.  Procedures .Foreign Body Removal  Date/Time: 02/24/2023 8:00 PM  Performed by: Melene Plan, DO Authorized by: Melene Plan, DO  Consent: Verbal consent obtained. Risks and benefits: risks, benefits and alternatives were discussed Required items: required blood products, implants, devices, and special equipment available Intake: ring. Anesthesia: digital block  Anesthesia: Local Anesthetic: lidocaine 1% with epinephrine  Sedation: Patient sedated: no  Complexity: simple 1 objects recovered. Objects recovered: ring Post-procedure assessment: foreign body removed      Medications Ordered in ED Medications  lidocaine-EPINEPHrine (XYLOCAINE W/EPI) 2 %-1:200000 (PF) injection 10 mL (10 mLs Intradermal Given by Other 02/24/23 1821)    ED Course/  Medical Decision Making/ A&P                             Medical Decision Making Risk Prescription drug management.   32 yo F with a cc of left ring finger ring stuck on.  Multiple attempts now swollen.  Will try and remove.    Ring was eventually removed.  Will discharge home.  PCP follow-up.  8:01 PM:  I have discussed the diagnosis/risks/treatment options with the patient.  Evaluation and diagnostic testing in the emergency department does not suggest an emergent condition requiring admission or immediate intervention beyond what has been performed at this time.  They will follow up with PCP. We also discussed returning to the ED immediately if new or worsening sx occur. We discussed the sx which are most concerning (e.g., sudden worsening pain, fever, inability to tolerate by mouth) that necessitate immediate return. Medications administered to the patient during their visit and any new prescriptions provided to the patient are listed below.  Medications given during this visit Medications  lidocaine-EPINEPHrine (XYLOCAINE W/EPI) 2 %-1:200000 (PF) injection 10 mL (10 mLs Intradermal Given by Other 02/24/23 1821)     The patient appears reasonably screen and/or stabilized for discharge and I doubt any other medical condition or other [facility] requiring further screening, evaluation, or treatment in the ED at this time prior to discharge.          Final Clinical Impression(s) / ED Diagnoses Final diagnoses:  Finger pain, left    Rx / DC Orders ED Discharge Orders     None         Melene Plan, DO 02/24/23 2001

## 2023-02-24 NOTE — ED Triage Notes (Signed)
Pt reports unable to remove ring from LT middle finger since about 0200

## 2023-02-24 NOTE — ED Notes (Signed)
Discharge paperwork reviewed entirely with patient, including follow up care. Pain was under control. No prescriptions were called in, but all questions were addressed.  Pt verbalized understanding as well as all parties involved. No questions or concerns voiced at the time of discharge. No acute distress noted.   Pt ambulated out to PVA without incident or assistance.  

## 2023-02-24 NOTE — ED Notes (Signed)
Ring eventually removed by GEM ring cutter. Distal PMS intact. Full ROM intact. No complaints.

## 2023-02-24 NOTE — Discharge Instructions (Signed)
Take 4 over the counter ibuprofen tablets 3 times a day or 2 over-the-counter naproxen tablets twice a day for pain. Also take tylenol 1000mg(2 extra strength) four times a day.
